# Patient Record
Sex: Male | Born: 1960 | Race: White | Hispanic: No | Marital: Single | Smoking: Current every day smoker
Health system: Southern US, Community
[De-identification: ages and names within clinical notes are randomized; demographics above are authoritative.]

## PROBLEM LIST (undated history)

## (undated) DIAGNOSIS — J189 Pneumonia, unspecified organism: Secondary | ICD-10-CM

## (undated) DIAGNOSIS — I10 Essential (primary) hypertension: Secondary | ICD-10-CM

## (undated) DIAGNOSIS — E7800 Pure hypercholesterolemia, unspecified: Secondary | ICD-10-CM

## (undated) HISTORY — PX: BACK SURGERY: SHX140

## (undated) HISTORY — PX: CHEST TUBE INSERTION: SHX231

## (undated) HISTORY — PX: LEG SURGERY: SHX1003

---

## 2016-02-04 ENCOUNTER — Emergency Department
Admission: EM | Admit: 2016-02-04 | Discharge: 2016-02-04 | Disposition: A | Discharge status: Home / Self Care | Payer: Self-pay | Attending: Emergency Medicine | Admitting: Emergency Medicine

## 2016-02-04 ENCOUNTER — Encounter: Payer: Self-pay | Admitting: Emergency Medicine

## 2016-02-04 ENCOUNTER — Emergency Department: Payer: Self-pay

## 2016-02-04 DIAGNOSIS — E876 Hypokalemia: Secondary | ICD-10-CM | POA: Insufficient documentation

## 2016-02-04 DIAGNOSIS — Z720 Tobacco use: Secondary | ICD-10-CM

## 2016-02-04 DIAGNOSIS — Z79899 Other long term (current) drug therapy: Secondary | ICD-10-CM | POA: Insufficient documentation

## 2016-02-04 DIAGNOSIS — Z880 Allergy status to penicillin: Secondary | ICD-10-CM | POA: Insufficient documentation

## 2016-02-04 DIAGNOSIS — F17210 Nicotine dependence, cigarettes, uncomplicated: Secondary | ICD-10-CM | POA: Insufficient documentation

## 2016-02-04 DIAGNOSIS — J441 Chronic obstructive pulmonary disease with (acute) exacerbation: Secondary | ICD-10-CM | POA: Insufficient documentation

## 2016-02-04 DIAGNOSIS — J069 Acute upper respiratory infection, unspecified: Secondary | ICD-10-CM | POA: Insufficient documentation

## 2016-02-04 DIAGNOSIS — R110 Nausea: Secondary | ICD-10-CM | POA: Insufficient documentation

## 2016-02-04 DIAGNOSIS — I10 Essential (primary) hypertension: Secondary | ICD-10-CM | POA: Insufficient documentation

## 2016-02-04 HISTORY — DX: Pure hypercholesterolemia, unspecified: E78.00

## 2016-02-04 HISTORY — DX: Pneumonia, unspecified organism: J18.9

## 2016-02-04 HISTORY — DX: Essential (primary) hypertension: I10

## 2016-02-04 LAB — COMPREHENSIVE METABOLIC PANEL
ALBUMIN: 4.5 g/dL (ref 3.5–5.0)
ALT: 29 U/L (ref 17–63)
AST: 46 U/L — ABNORMAL HIGH (ref 15–41)
Alkaline Phosphatase: 81 U/L (ref 38–126)
Anion gap: 10 (ref 5–15)
BUN: 12 mg/dL (ref 6–20)
CHLORIDE: 90 mmol/L — AB (ref 101–111)
CO2: 31 mmol/L (ref 22–32)
Calcium: 9.1 mg/dL (ref 8.9–10.3)
Creatinine, Ser: 0.78 mg/dL (ref 0.61–1.24)
GFR calc non Af Amer: >60 mL/min (ref >60)
GLUCOSE: 105 mg/dL — AB (ref 65–99)
Potassium: 3.7 mmol/L (ref 3.5–5.1)
SODIUM: 131 mmol/L — AB (ref 135–145)
Total Bilirubin: 1.3 mg/dL — ABNORMAL HIGH (ref 0.3–1.2)
Total Protein: 8 g/dL (ref 6.5–8.1)

## 2016-02-04 LAB — CBC WITH DIFFERENTIAL/PLATELET
Basophils Absolute: 0 10*3/uL (ref 0–0.1)
Basophils Relative: 1 %
Eosinophils Absolute: 0.3 10*3/uL (ref 0–0.7)
Eosinophils Relative: 4 %
HCT: 43.1 % (ref 40.0–52.0)
Hemoglobin: 15.5 g/dL (ref 13.0–18.0)
LYMPHS ABS: 1.1 10*3/uL (ref 1.0–3.6)
MCH: 34.9 pg — AB (ref 26.0–34.0)
MCHC: 35.9 g/dL (ref 32.0–36.0)
MCV: 97.1 fL (ref 80.0–100.0)
MONO ABS: 0.7 10*3/uL (ref 0.2–1.0)
NEUTROS ABS: 4.9 10*3/uL (ref 1.4–6.5)
Platelets: 216 10*3/uL (ref 150–440)
RBC: 4.44 MIL/uL (ref 4.40–5.90)
RDW: 12.7 % (ref 11.5–14.5)
WBC: 7.1 10*3/uL (ref 3.8–10.6)

## 2016-02-04 LAB — RAPID INFLUENZA A&B ANTIGENS
Influenza A (ARMC): NEGATIVE
Influenza B (ARMC): NEGATIVE

## 2016-02-04 LAB — TROPONIN I: Troponin I: <0.03 ng/mL (ref <0.031)

## 2016-02-04 MED ORDER — POTASSIUM CHLORIDE CRYS ER 20 MEQ PO TBCR
40.0000 meq | EXTENDED_RELEASE_TABLET | Freq: Once | ORAL | Status: AC
  Administered 2016-02-04: 40 meq via ORAL
  Filled 2016-02-04: qty 2

## 2016-02-04 MED ORDER — AZITHROMYCIN 500 MG PO TABS
500.0000 mg | ORAL_TABLET | Freq: Once | ORAL | Status: AC
  Administered 2016-02-04: 500 mg via ORAL
  Filled 2016-02-04: qty 1

## 2016-02-04 MED ORDER — ALBUTEROL SULFATE HFA 108 (90 BASE) MCG/ACT IN AERS: 2.0000 | INHALATION_SPRAY | RESPIRATORY_TRACT | Status: AC | PRN

## 2016-02-04 MED ORDER — PREDNISONE 20 MG PO TABS: 60.0000 mg | ORAL_TABLET | Freq: Every day | ORAL | Status: DC

## 2016-02-04 MED ORDER — PREDNISONE 20 MG PO TABS
60.0000 mg | ORAL_TABLET | Freq: Once | ORAL | Status: AC
  Administered 2016-02-04: 60 mg via ORAL
  Filled 2016-02-04: qty 3

## 2016-02-04 MED ORDER — AZITHROMYCIN 250 MG PO TABS: 250.0000 mg | ORAL_TABLET | Freq: Every day | ORAL | Status: DC

## 2016-02-04 NOTE — ED Notes (Signed)
Pt returned to room  

## 2016-02-04 NOTE — ED Notes (Signed)
Pt verbalized understanding of discharge instructions. NAD at this time. 

## 2016-02-04 NOTE — ED Notes (Signed)
Pt. States he has had shortness of breath, cough with cold chills for the past week.

## 2016-02-04 NOTE — ED Provider Notes (Signed)
[facility] Emergency Department Provider Note  ____________________________________________  Time seen: Approximately 7:45 AM  I have reviewed the triage vital signs and the nursing notes.   HISTORY  Chief Complaint Shortness of Breath    HPI Geoffrey Walker is a 55 y.o. male with a long history of ongoing tobacco abuse, HTN, HL and a history of pneumonia requiring chest tube placement for pleural effusion remotely presenting with cough and congestion. Patient reports that for 1 week he has been having a cough productive of clear sputum. He has also had congestion and mild rhinorrhea without sore throat or ear pain. He has had some mild exertional shortness of breath but no chest pain or palpitations. The patient woke up at 4:30 AM due to coughing spell, and when he stood up to go to the bathroom he developed a lightheaded sensation associated with nausea and followed by diaphoresis. He did not vomit. No diarrhea or abdominal pain. No fevers or chills. At this time, his symptoms have significantly improved.   Past Medical History  Diagnosis Date  . Hypertension   . High cholesterol   . Pneumonia     There are no active problems to display for this patient.   Past Surgical History  Procedure Laterality Date  . Back surgery    . Chest tube insertion    . Leg surgery Right     Current Outpatient Rx  Name  Route  Sig  Dispense  Refill  . atorvastatin (LIPITOR) 40 MG tablet   Oral   Take 40 mg by mouth daily.         . carvedilol (COREG) 12.5 MG tablet   Oral   Take 12.5 mg by mouth 2 (two) times daily with a meal.         . cholecalciferol (VITAMIN D) 1000 units tablet   Oral   Take 1,000 Units by mouth daily.         . hydrochlorothiazide (HYDRODIURIL) 25 MG tablet   Oral   Take 25 mg by mouth daily.         . Multiple Vitamin (MULTIVITAMIN) capsule   Oral   Take 1 capsule by mouth daily.         . vitamin B-12 (CYANOCOBALAMIN)  50 MCG tablet   Oral   Take 50 mcg by mouth daily.         . vitamin E 400 UNIT capsule   Oral   Take 400 Units by mouth daily.         Marland Kitchen. albuterol (PROVENTIL HFA;VENTOLIN HFA) 108 (90 Base) MCG/ACT inhaler   Inhalation   Inhale 2 puffs into the lungs every 4 (four) hours as needed for wheezing or shortness of breath.   1 Inhaler   0   . azithromycin (ZITHROMAX) 250 MG tablet   Oral   Take 1 tablet (250 mg total) by mouth daily.   4 tablet   0   . predniSONE (DELTASONE) 20 MG tablet   Oral   Take 3 tablets (60 mg total) by mouth daily.   15 tablet   0     Allergies Penicillins  History reviewed. No pertinent family history.  Social History Social History  Substance Use Topics  . Smoking status: Current Every Day Smoker -- 1.00 packs/day    Types: Cigarettes  . Smokeless tobacco: None  . Alcohol Use: 14.4 oz/week    24 Cans of beer per week    Review of Systems Constitutional:  No fever/chills. Positive lightheadedness. Negative syncope. Eyes: No visual changes. ENT: No sore throat. Cardiovascular: Denies chest pain, palpitations. Respiratory: Positive exertional shortness of breath.  Positive productive cough. Gastrointestinal: No abdominal pain.  Positive nausea, no vomiting.  No diarrhea.  No constipation. Genitourinary: Negative for dysuria. Musculoskeletal: Negative for back pain. Skin: Negative for rash. Neurological: Negative for headaches, focal weakness or numbness.  10-point ROS otherwise negative.  ____________________________________________   PHYSICAL EXAM:  VITAL SIGNS: ED Triage Vitals  Enc Vitals Group     BP 02/04/16 0552 168/99 mmHg     Pulse Rate 02/04/16 0552 76     Resp 02/04/16 0552 20     Temp 02/04/16 0552 97.7 F (36.5 C)     Temp Source 02/04/16 0552 Oral     SpO2 02/04/16 0552 95 %     Weight 02/04/16 0552 219 lb (99.338 kg)     Height 02/04/16 0552  (1.778 m)     Head Cir --      Peak Flow --      Pain  Score 02/04/16 0552 0     Pain Loc --      Pain Edu? --      Excl. in GC? --     Constitutional: Alert and oriented. Well appearing and in no acute distress. Answer question appropriately. Eyes: Conjunctivae are normal.  EOMI.No eye discharge. Head: Atraumatic. Nose: Positive congestion without active rhinorrhea. Mouth/Throat: Mucous membranes are moist.  Neck: No stridor.  Supple.  No JVD. Cardiovascular: Normal rate, regular rhythm. No murmurs, rubs or gallops.  Respiratory: Normal respiratory effort.  No retractions. Lungs CTAB.  No wheezes, rales or ronchi. Prolonged expiratory rate. Gastrointestinal: Soft and nontender. No distention. No peritoneal signs. Musculoskeletal: No LE edema. No palpable cords or tenderness to palpation in the calves. Negative Homans sign. Neurologic:  Normal speech and language. No gross focal neurologic deficits are appreciated.  Skin:  Skin is warm, dry and intact. No rash noted. Psychiatric: Mood and affect are normal. Speech and behavior are normal.  Normal judgement.  ____________________________________________   LABS (all labs ordered are listed, but only abnormal results are displayed)  Labs Reviewed  COMPREHENSIVE METABOLIC PANEL - Abnormal; Notable for the following:    Sodium 131 (*)    Chloride 90 (*)    Glucose, Bld 105 (*)    AST 46 (*)    Total Bilirubin 1.3 (*)    All other components within normal limits  CBC WITH DIFFERENTIAL/PLATELET - Abnormal; Notable for the following:    MCH 34.9 (*)    All other components within normal limits  RAPID INFLUENZA A&B ANTIGENS ([facility] ONLY)  TROPONIN I   ____________________________________________  EKG  ED ECG REPORT I, Rockne Menghini, the attending physician, personally viewed and interpreted this ECG.   Date: 02/04/2016  EKG Time: 617  Rate: 67  Rhythm: normal sinus rhythm  Axis: Normal  Intervals:none  ST&T Change: Nonspecific T-wave inversion in V1. No ST  elevation.  ____________________________________________  RADIOLOGY  Dg Chest 2 View  02/04/2016  CLINICAL DATA:  55 year old male with a history of shortness of breath. EXAM: CHEST - 2 VIEW COMPARISON:  None. FINDINGS: Cardiomediastinal silhouette within normal limits. No evidence of central vascular congestion. Stigmata of emphysema, with increased retrosternal airspace, flattened hemidiaphragms, increased AP diameter, and hyperinflation on the AP view. Coarsened interstitial markings bilateral lungs. No pneumothorax. No confluent airspace disease. No pleural effusion. Elevation the right hemidiaphragm with blunting of the right costophrenic  angle. No displaced fracture. Unremarkable appearance of the upper abdomen. IMPRESSION: Emphysema and presumed chronic lung changes with no evidence of lobar pneumonia. Signed, Yvone Neu. Loreta Ave, DO [facility] Radiology Electronically Signed   By: Gilmer Mor D.O.   On: 02/04/2016 07:09    ____________________________________________   PROCEDURES  Procedure(s) performed: None  Critical Care performed: No ____________________________________________   INITIAL IMPRESSION / ASSESSMENT AND PLAN / ED COURSE  Pertinent labs & imaging results that were available during my care of the patient were reviewed by me and considered in my medical decision making (see chart for details).  55 y.o. male with long-standing tobacco abuse presenting with cough and congestion, with an episode of diaphoresis nausea and lightheadedness today. The patient has never been diagnosed with COPD but does have emphysematous changes on his chest x-ray which is correlated with the clinical history of decades of tobacco abuse. He likely has a COPD exacerbation, and I will evaluate him for flu. He does not have any evidence of pneumonia. It is less likely that the patient's symptoms are cardiac in origin, with a reassuring EKG. His  troponin is pending. At this time, the patient is breathing comfortably and maintaining oxygen saturations in the mid 90s. I'm awaiting his lab results, and anticipate discharge home with steroids and antibiotics, close PMD follow-up.  ____________________________________________  FINAL CLINICAL IMPRESSION(S) / ED DIAGNOSES  Final diagnoses:  COPD with acute exacerbation (HCC)  URI (upper respiratory infection)  Tobacco abuse  Hypokalemia      NEW MEDICATIONS STARTED DURING THIS VISIT:  Discharge Medication List as of 02/04/2016  8:57 AM    START taking these medications   Details  azithromycin (ZITHROMAX) 250 MG tablet Take 1 tablet (250 mg total) by mouth daily., Starting 02/04/2016, Until Discontinued, Print    predniSONE (DELTASONE) 20 MG tablet Take 3 tablets (60 mg total) by mouth daily., Starting 02/04/2016, Until Discontinued, Print         Rockne Menghini, MD 02/04/16 [phone]

## 2017-08-13 IMAGING — CR DG CHEST 2V
2 series · 2 of 2 positions shown · non-contrast
Comparison: None.

CLINICAL DATA: 54-year-old male with a history of shortness of
breath.

EXAM:
CHEST - 2 VIEW

[chest pa]
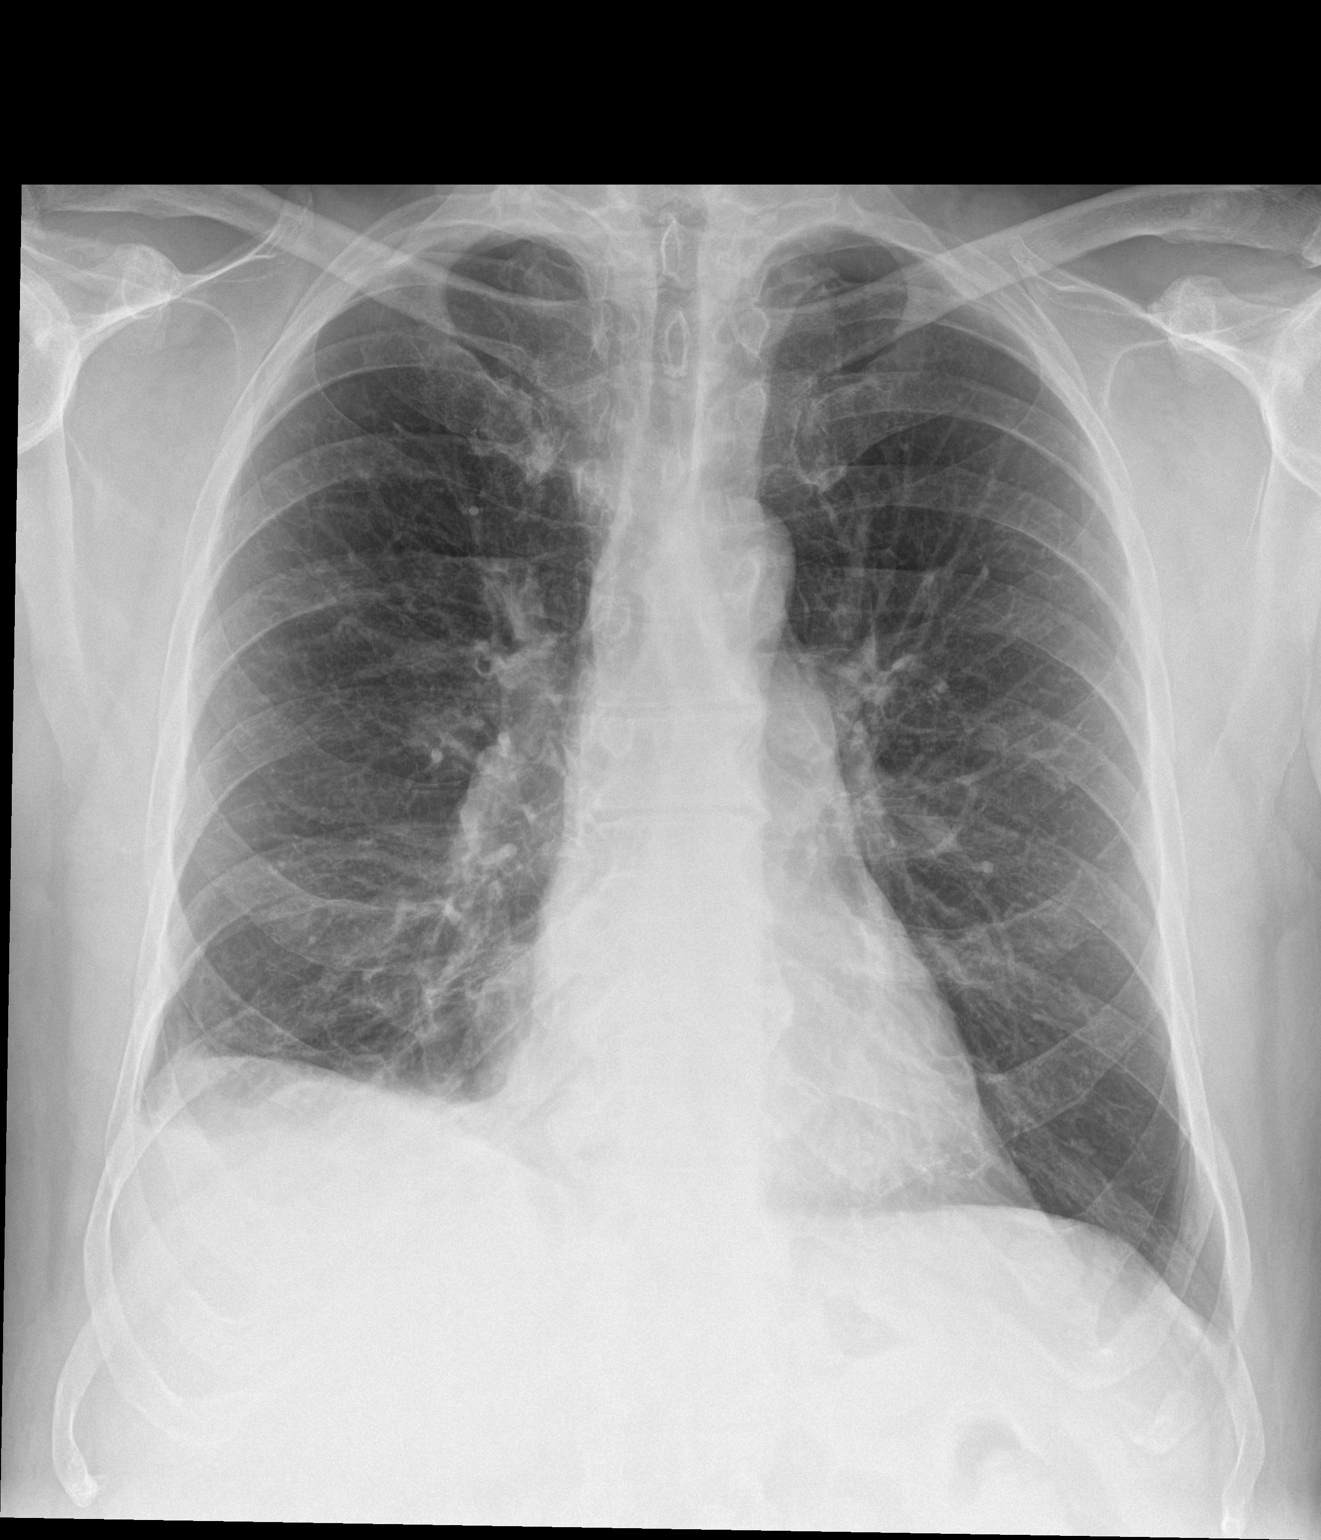

[chest lat]
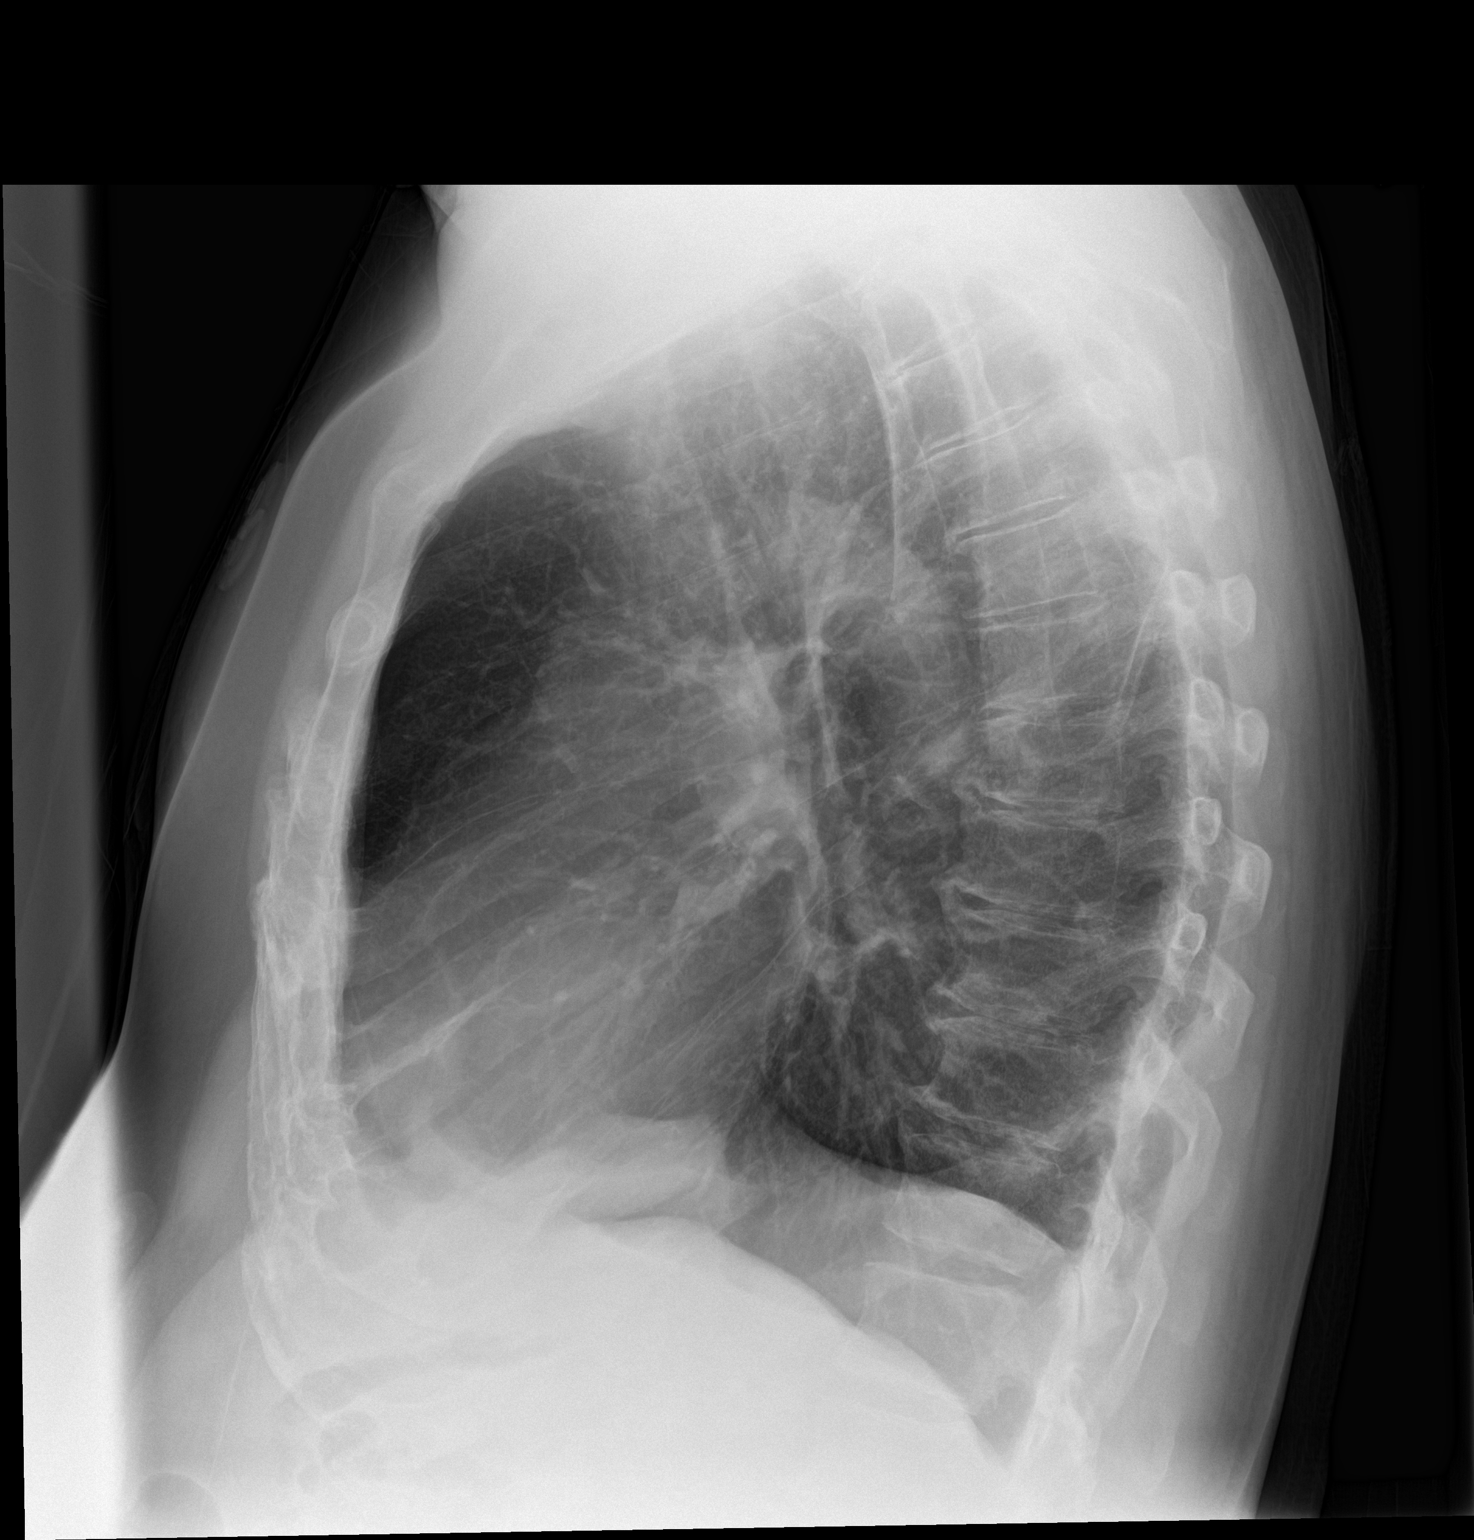

[2 of 2 positions shown; findings below may reference images not displayed]

FINDINGS: Cardiomediastinal silhouette within normal limits. No evidence of
central vascular congestion.

Stigmata of emphysema, with increased retrosternal airspace,
flattened hemidiaphragms, increased AP diameter, and hyperinflation
on the AP view.

Coarsened interstitial markings bilateral lungs.

No pneumothorax. No confluent airspace disease. No pleural effusion.

Elevation the right hemidiaphragm with blunting of the right
costophrenic angle.

No displaced fracture.

Unremarkable appearance of the upper abdomen.
IMPRESSION: Emphysema and presumed chronic lung changes with no evidence of
lobar pneumonia.

## 2018-11-18 ENCOUNTER — Other Ambulatory Visit: Payer: Self-pay | Admitting: Family Medicine

## 2018-11-18 DIAGNOSIS — J449 Chronic obstructive pulmonary disease, unspecified: Secondary | ICD-10-CM

## 2018-12-16 ENCOUNTER — Ambulatory Visit: Payer: Self-pay

## 2019-01-13 ENCOUNTER — Ambulatory Visit: Payer: Self-pay

## 2019-01-15 ENCOUNTER — Ambulatory Visit: Payer: Medicaid Other

## 2019-01-22 ENCOUNTER — Ambulatory Visit: Payer: Medicaid Other

## 2019-01-29 ENCOUNTER — Other Ambulatory Visit: Payer: Self-pay

## 2019-01-29 ENCOUNTER — Ambulatory Visit: Payer: Disability Insurance

## 2019-01-29 ENCOUNTER — Ambulatory Visit
Admission: RE | Admit: 2019-01-29 | Discharge: 2019-01-29 | Disposition: A | Discharge status: Home / Self Care | Payer: Disability Insurance | Source: Ambulatory Visit | Attending: Family Medicine | Admitting: Family Medicine

## 2019-01-29 DIAGNOSIS — J449 Chronic obstructive pulmonary disease, unspecified: Secondary | ICD-10-CM | POA: Diagnosis present

## 2019-01-29 MED ORDER — ALBUTEROL SULFATE (2.5 MG/3ML) 0.083% IN NEBU
2.5000 mg | INHALATION_SOLUTION | Freq: Once | RESPIRATORY_TRACT | Status: AC
  Administered 2019-01-29: 2.5 mg via RESPIRATORY_TRACT
  Filled 2019-01-29: qty 3

## 2024-03-02 ENCOUNTER — Other Ambulatory Visit: Payer: Self-pay

## 2024-03-02 ENCOUNTER — Emergency Department

## 2024-03-02 DIAGNOSIS — Z79899 Other long term (current) drug therapy: Secondary | ICD-10-CM | POA: Insufficient documentation

## 2024-03-02 DIAGNOSIS — W1830XA Fall on same level, unspecified, initial encounter: Secondary | ICD-10-CM | POA: Diagnosis not present

## 2024-03-02 DIAGNOSIS — M545 Low back pain, unspecified: Secondary | ICD-10-CM | POA: Insufficient documentation

## 2024-03-02 DIAGNOSIS — I10 Essential (primary) hypertension: Secondary | ICD-10-CM | POA: Insufficient documentation

## 2024-03-02 NOTE — ED Notes (Signed)
 First Nurse Note: Pt arrives via [facility] from home with complaints of lower back pain following a fall on Saturday on concrete. Rates pain 10/10.  VSS with EMS 2L [place] at baseline

## 2024-03-02 NOTE — ED Triage Notes (Signed)
 Pt to ED via EMS from home, pt reports fall on Saturday when he was standing up from wheelchair, pt states the chair wasn't locked and he fell backwards. Pt reports lower back pain from fall.

## 2024-03-03 ENCOUNTER — Emergency Department
Admission: EM | Admit: 2024-03-03 | Discharge: 2024-03-03 | Disposition: A | Discharge status: Home / Self Care | Attending: Emergency Medicine | Admitting: Emergency Medicine

## 2024-03-03 DIAGNOSIS — M545 Low back pain, unspecified: Secondary | ICD-10-CM

## 2024-03-03 DIAGNOSIS — W19XXXA Unspecified fall, initial encounter: Secondary | ICD-10-CM

## 2024-03-03 MED ORDER — HYDROMORPHONE HCL 1 MG/ML IJ SOLN
2.0000 mg | Freq: Once | INTRAMUSCULAR | Status: AC
  Administered 2024-03-03: 2 mg via INTRAMUSCULAR
  Filled 2024-03-03: qty 2

## 2024-03-03 MED ORDER — LORATADINE 10 MG PO TABS
10.0000 mg | ORAL_TABLET | Freq: Once | ORAL | Status: AC
  Administered 2024-03-03: 10 mg via ORAL
  Filled 2024-03-03: qty 1

## 2024-03-03 MED ORDER — OXYCODONE-ACETAMINOPHEN 5-325 MG PO TABS
1.0000 | ORAL_TABLET | ORAL | Status: DC | PRN
  Administered 2024-03-03: 1 via ORAL
  Filled 2024-03-03: qty 1

## 2024-03-03 MED ORDER — CYCLOBENZAPRINE HCL 10 MG PO TABS
5.0000 mg | ORAL_TABLET | Freq: Once | ORAL | Status: AC
  Administered 2024-03-03: 5 mg via ORAL
  Filled 2024-03-03: qty 1

## 2024-03-03 MED ORDER — KETOROLAC TROMETHAMINE 60 MG/2ML IM SOLN
30.0000 mg | Freq: Once | INTRAMUSCULAR | Status: AC
  Administered 2024-03-03: 30 mg via INTRAMUSCULAR
  Filled 2024-03-03: qty 2

## 2024-03-03 MED ORDER — HYDROMORPHONE HCL 2 MG PO TABS: 2.0000 mg | ORAL_TABLET | Freq: Four times a day (QID) | ORAL | 0 refills | Status: DC | PRN

## 2024-03-03 MED ORDER — OXYCODONE-ACETAMINOPHEN 5-325 MG PO TABS
1.0000 | ORAL_TABLET | Freq: Once | ORAL | Status: AC
  Administered 2024-03-03: 1 via ORAL
  Filled 2024-03-03: qty 1

## 2024-03-03 MED ORDER — CYCLOBENZAPRINE HCL 5 MG PO TABS: ORAL_TABLET | ORAL | 0 refills | Status: DC

## 2024-03-03 NOTE — TOC Transition Note (Signed)
 Transition of Care [facility]) - Discharge Note   Patient Details  Name: Geoffrey Walker MRN: [id number] Date of Birth: 17-Jan-1961  [facility] of [facility]) CM/SW Contact:  Elmira Haddock, LCSW Phone Number: 03/03/2024, 12:37 PM   Clinical Narrative:   CSW received a consult for HH/DME for patient.  Patient discharged prior to CSW being able to speak with him.  CSW phoned patient.  He was at home.  CSW discussed recommendations with patient.  He stated that he already as those services set up through his insurance.  TOC signing off.           Patient Goals and CMS Choice            Discharge Placement                       Discharge Plan and Services Additional resources added to the After Visit Summary for                                       Social Drivers of Health (SDOH) Interventions SDOH Screenings   Food Insecurity: No Food Insecurity (09/21/2023)   Received from [facility]  Transportation Needs: No Transportation Needs (09/21/2023)   Received from [facility]  Utilities: Low Risk  (09/21/2023)   Received from [facility]  Financial Resource Strain: Low Risk  (09/21/2023)   Received from [facility]  Tobacco Use: High Risk (03/02/2024)     Readmission Risk Interventions     No data to display

## 2024-03-03 NOTE — ED Notes (Addendum)
 Pt informed this paramedic that he was living in a hotel alone and was brought to the hotel by family after getting out of rehab.   The pt was informed that he was up for discharge and EMS would not be able to transport him back to the hotel. The pt then became irate and stated that he would not be calling his family this early in the morning and he was feeling "inconvenienced" and it was the hospitals job to figure out he was going to get home.  The pt stated that his son whom lives a mile away form him couldn't come because he works as do the rest of his family members.   The charge nurse walked into the pt's room and informed him that EMS would not transport him to the hospital but he would be able to speak to our social worker with regards to helping him with th following steps of finding housing etc. The pt stated that it was good idea and he would think about it. The pt then demanded that if he was going be staying here he would want to do it with a cup a coffee.   The pt called out a few minutes later and stated that his phone was broken here in this hospital and that staff had to figure out who did it because it was our responsibility. The pt stated that his phone was dropped numerous times. The pt was informed that his phone was on the floor and placed on the bench seat. The pt stated that his 63 year old phone did not work the same as his new phone. The pt was noted to become very irate and accusatory  towards this paramedic. The pt was asked if it was possible if EMS broke his phone he stated that he didn't know because he didn't look at it then.  The pt was informed that his phone still turned on and would probably not work as well has his new phone. The pt stated " I don't care if it's 63 years old. You broke my phone."  The pt was told by this paramedic that he needed to stop yelling at her. The pt then laughed and try to make a joke of the situation when he realized that a Engineer, materials had  entered the room. The pt demeaned to speak to the charge nurse.    The charge nurse entered the pt's room and informed him that his belongings are his responsibility. The pt stated that it was best if this whole topic was dismissed.

## 2024-03-03 NOTE — ED Provider Notes (Signed)
 [facility] Provider Note    Event Date/Time   First MD Initiated Contact with Patient 03/03/24 0136     (approximate)   History   Back Pain   HPI  Geoffrey Walker is a 63 y.o. male brought to the ED via EMS from home with a chief complaint of low back pain status post mechanical fall on Saturday when he was using his rollator.  Patient states he was discharged from rehab after 5 years last week.  Lives independently.  Presents with low back pain and spasms.  Denies extremity weakness/numbness/tingling.  Denies bowel or bladder incontinence.  Denies dysuria.     Past Medical History   Past Medical History:  Diagnosis Date   High cholesterol    Hypertension    Pneumonia      Active Problem List  There are no active problems to display for this patient.    Past Surgical History   Past Surgical History:  Procedure Laterality Date   BACK SURGERY     CHEST TUBE INSERTION     LEG SURGERY Right      Home Medications   Prior to Admission medications   Medication Sig Start Date End Date Taking? Authorizing Provider  cyclobenzaprine  (FLEXERIL ) 5 MG tablet 1 tablet every 8 hours as needed for muscle spasms 03/03/24  Yes Almena Hokenson J, MD  HYDROmorphone  (DILAUDID ) 2 MG tablet Take 1 tablet (2 mg total) by mouth every 6 (six) hours as needed for severe pain (pain score 7-10). 03/03/24  Yes Norlene Beavers, MD  albuterol  (PROVENTIL  HFA;VENTOLIN  HFA) 108 (90 Base) MCG/ACT inhaler Inhale 2 puffs into the lungs every 4 (four) hours as needed for wheezing or shortness of breath. 02/04/16   Bart Lieu, MD  atorvastatin (LIPITOR) 40 MG tablet Take 40 mg by mouth daily.    [provider]  azithromycin  (ZITHROMAX ) 250 MG tablet Take 1 tablet (250 mg total) by mouth daily. 02/04/16   Bart Lieu, MD  carvedilol (COREG) 12.5 MG tablet Take 12.5 mg by mouth 2 (two) times daily with a meal.    [provider]  cholecalciferol  (VITAMIN D) 1000 units tablet Take 1,000 Units by mouth daily.    [provider]  hydrochlorothiazide (HYDRODIURIL) 25 MG tablet Take 25 mg by mouth daily.    [provider]  Multiple Vitamin (MULTIVITAMIN) capsule Take 1 capsule by mouth daily.    [provider]  predniSONE  (DELTASONE ) 20 MG tablet Take 3 tablets (60 mg total) by mouth daily. 02/04/16   Bart Lieu, MD  vitamin B-12 (CYANOCOBALAMIN) 50 MCG tablet Take 50 mcg by mouth daily.    [provider]  vitamin E 400 UNIT capsule Take 400 Units by mouth daily.    [provider]     Allergies  Penicillins   Family History  History reviewed. No pertinent family history.   Physical Exam  Triage Vital Signs: ED Triage Vitals  Encounter Vitals Group     BP 03/02/24 2219 (!) 141/72     Systolic BP Percentile --      Diastolic BP Percentile --      Pulse Rate 03/02/24 2219 (!) 104     Resp 03/02/24 2219 18     Temp 03/02/24 2219 98.1 F (36.7 C)     Temp Source 03/02/24 2219 Oral     SpO2 03/02/24 2219 99 %     Weight 03/02/24 2218 260 lb (117.9 kg)  Height 03/02/24 2218 5\' 10"  (1.778 m)     Head Circumference --      Peak Flow --      Pain Score 03/02/24 2218 10     Pain Loc --      Pain Education --      Exclude from Growth Chart --     Updated Vital Signs: BP (!) 151/91 (BP Location: Right Arm)   Pulse 100   Temp 98.4 F (36.9 C)   Resp 18   Ht 5\' 10"  (1.778 m)   Wt 117.9 kg   SpO2 100%   BMI 37.31 kg/m    General: Awake, mild distress.  CV:  RRR.  Good peripheral perfusion.  Resp:  Normal effort.  CTAB. Abd:  Morbidly obese.  No distention.  Other:  No spinal tenderness to palpation.  Bilateral paralumbar muscle spasms.  Negative straight leg raise.  5/5 motor strength and sensation BLE.   ED Results / Procedures / Treatments  Labs (all labs ordered are listed, but only abnormal results are displayed) Labs Reviewed - No data to  display   EKG  None   RADIOLOGY I have independently visualized interpreted patient's imaging study as well as noted the radiology interpretation:  Lumbar spine: No acute osseous injury  Official radiology report(s): DG Lumbar Spine 2-3 Views Result Date: 03/02/2024 CLINICAL DATA:  Low back pain after fall. EXAM: LUMBAR SPINE - 2-3 VIEW COMPARISON:  None Available. FINDINGS: Five non-rib-bearing lumbar vertebra. No evidence of acute fracture or compression deformity. Normal lumbar alignment. Mild diffuse disc space narrowing and spurring. Mild multilevel facet hypertrophy. The sacroiliac joints are congruent. IMPRESSION: 1. No fracture or subluxation of the lumbar spine. 2. Multilevel degenerative disc disease and facet hypertrophy. Electronically Signed   By: Chadwick Colonel M.D.   On: 03/02/2024 23:11     PROCEDURES:  Critical Care performed: No  Procedures   MEDICATIONS ORDERED IN ED: Medications  ketorolac  (TORADOL ) injection 30 mg (30 mg Intramuscular Given 03/03/24 0211)  oxyCODONE -acetaminophen  (PERCOCET/ROXICET) 5-325 MG per tablet 1 tablet (1 tablet Oral Given 03/03/24 0212)  cyclobenzaprine  (FLEXERIL ) tablet 5 mg (5 mg Oral Given 03/03/24 0208)  HYDROmorphone  (DILAUDID ) injection 2 mg (2 mg Intramuscular Given 03/03/24 0422)     IMPRESSION / MDM / ASSESSMENT AND PLAN / ED COURSE  I reviewed the triage vital signs and the nursing notes.                             63 year old male who presents with low back pain status post mechanical fall.  Imaging studies negative for acute osseous injury.  Will administer IM Ketorolac , Percocet, Flexeril  and reassess.   Patient's presentation is most consistent with acute, uncomplicated illness.  Clinical Course as of 03/03/24 2956  Tue Mar 03, 2024  0356 Slight improvement in pain but still complaining of severe pain.  Will add IM Dilaudid  and reassess.  Remains neurologically intact. [JS]  0518 Patient feeling better after IM  Dilaudid .  Will discharge home with prescriptions and patient will follow-up closely with his PCP.  Will write prescription for wheelchair.  Strict return precautions given.  Patient verbalizes understanding and agrees with plan of care. [JS]  0620 Patient living in local motel.  EMS will not transport back to motel.  Patient does not wish to call family members at this time given the early hour.  Will also consult TOC for home health needs.  Patient  states when he was discharged from rehab they asked him to set everything up but he does not have the resources to do so. [JS]    Clinical Course User Index [JS] Norlene Beavers, MD   FINAL CLINICAL IMPRESSION(S) / ED DIAGNOSES   Final diagnoses:  Acute bilateral low back pain without sciatica  Fall, initial encounter     Rx / DC Orders   ED Discharge Orders          Ordered    cyclobenzaprine  (FLEXERIL ) 5 MG tablet        03/03/24 0520    HYDROmorphone  (DILAUDID ) 2 MG tablet  Every 6 hours PRN        03/03/24 0520    Wheelchair        03/03/24 0521             Note:  This document was prepared using Dragon voice recognition software and may include unintentional dictation errors.   Norlene Beavers, MD 03/03/24 [phone]

## 2024-03-03 NOTE — Discharge Instructions (Signed)
 You may take medicines as needed for pain or muscle spasms.  Use wheelchair as needed to get around.  Return to the ER for worsening symptoms, persistent vomiting, difficulty breathing or other concerns.

## 2024-03-03 NOTE — Evaluation (Signed)
 Physical Therapy Evaluation Patient Details Name: Lorena Clearman MRN: [id number] DOB: 09-23-1961 Today's Date: 03/03/2024  History of Present Illness  63 y/o male presented to ED on 03/02/24 after fall on Saturday (4/19) on concrete. Imaging negative for fx. PMH: hx of lung removal 2021, HTN, hx of pneumonia  Clinical Impression  Patient admitted with the above. PTA, patient living at extended stay hotel after 5 year stay at rehab facility per patient. Patient reporting he uses rollator at baseline. Patient able to complete bed mobility and sit to stand independently with RW. Patient adamantly declining ambulation as patient stating "I know I can walk". Per patient, [facility] services was set up for him at discharge from [facility] No further skilled PT needs identified acutely.         If plan is discharge home, recommend the following:     Can travel by private vehicle        Equipment Recommendations None recommended by PT  Recommendations for Other Services       Functional Status Assessment Patient has not had a recent decline in their functional status     Precautions / Restrictions Precautions Precautions: Fall Recall of Precautions/Restrictions: Intact Restrictions Weight Bearing Restrictions Per Provider Order: No      Mobility  Bed Mobility Overal bed mobility: Independent                  Transfers Overall transfer level: Modified independent Equipment used: Rolling Cadynce Garrette (2 wheels)               General transfer comment: able to stand from elevated stretcher modI. Adamantly declined further mobility    Ambulation/Gait                  Stairs            Wheelchair Mobility     Tilt Bed    Modified Rankin (Stroke Patients Only)       Balance Overall balance assessment: Mild deficits observed, not formally tested                                           Pertinent Vitals/Pain Pain Assessment Pain  Assessment: No/denies pain    Home Living Family/patient expects to be discharged to:: Other (Comment)                   Additional Comments: currently living in extended stay hotel after being discharged from rehab after 5 years per patient    Prior Function Prior Level of Function : Independent/Modified Independent             Mobility Comments: uses rollator       Extremity/Trunk Assessment   Upper Extremity Assessment Upper Extremity Assessment: Overall WFL for tasks assessed    Lower Extremity Assessment Lower Extremity Assessment: Overall WFL for tasks assessed       Communication   Communication Communication: No apparent difficulties    Cognition Arousal: Alert Behavior During Therapy: WFL for tasks assessed/performed   PT - Cognitive impairments: No apparent impairments                         Following commands: Intact       Cueing       General Comments      Exercises     Assessment/Plan  PT Assessment All further PT needs can be met in the next venue of care  PT Problem List Decreased strength;Decreased activity tolerance;Decreased balance;Decreased mobility;Decreased safety awareness;Decreased knowledge of precautions       PT Treatment Interventions      PT Goals (Current goals can be found in the Care Plan section)  Acute Rehab PT Goals Patient Stated Goal: to go home PT Goal Formulation: All assessment and education complete, DC therapy    Frequency       Co-evaluation               AM-PAC PT "6 Clicks" Mobility  Outcome Measure Help needed turning from your back to your side while in a flat bed without using bedrails?: None Help needed moving from lying on your back to sitting on the side of a flat bed without using bedrails?: None Help needed moving to and from a bed to a chair (including a wheelchair)?: None Help needed standing up from a chair using your arms (e.g., wheelchair or bedside  chair)?: None Help needed to walk in hospital room?: A Little Help needed climbing 3-5 steps with a railing? : A Little 6 Click Score: 22    End of Session Equipment Utilized During Treatment: Oxygen Activity Tolerance: Patient tolerated treatment well Patient left: in bed;with call bell/phone within reach Nurse Communication: Mobility status PT Visit Diagnosis: Unsteadiness on feet (R26.81);Muscle weakness (generalized) (M62.81)    Time: 0454-0981 PT Time Calculation (min) (ACUTE ONLY): 21 min   Charges:   PT Evaluation $PT Eval Moderate Complexity: 1 Mod   PT General Charges $$ ACUTE PT VISIT: 1 Visit         Janine Melbourne, PT, DPT Physical Therapist - [facility]  [facility]   Aoki Wedemeyer A Akeila Lana 03/03/2024, 10:34 AM

## 2024-03-05 ENCOUNTER — Emergency Department
Admission: EM | Admit: 2024-03-05 | Discharge: 2024-03-06 | Disposition: A | Discharge status: Home / Self Care | Attending: Emergency Medicine | Admitting: Emergency Medicine

## 2024-03-05 ENCOUNTER — Other Ambulatory Visit: Payer: Self-pay

## 2024-03-05 ENCOUNTER — Encounter: Payer: Self-pay | Admitting: *Deleted

## 2024-03-05 DIAGNOSIS — M545 Low back pain, unspecified: Secondary | ICD-10-CM | POA: Diagnosis present

## 2024-03-05 DIAGNOSIS — M25552 Pain in left hip: Secondary | ICD-10-CM | POA: Diagnosis not present

## 2024-03-05 DIAGNOSIS — M5442 Lumbago with sciatica, left side: Secondary | ICD-10-CM | POA: Insufficient documentation

## 2024-03-05 DIAGNOSIS — W19XXXA Unspecified fall, initial encounter: Secondary | ICD-10-CM | POA: Diagnosis not present

## 2024-03-05 DIAGNOSIS — I10 Essential (primary) hypertension: Secondary | ICD-10-CM | POA: Insufficient documentation

## 2024-03-05 NOTE — ED Triage Notes (Signed)
 Pt brought in via ems from home with back pain.  Pt taking rx meds without relief.  Pt was seen here recently for similar sx.  Pt states he isn't any better.  Pt alert

## 2024-03-06 ENCOUNTER — Emergency Department

## 2024-03-06 MED ORDER — IPRATROPIUM-ALBUTEROL 0.5-2.5 (3) MG/3ML IN SOLN
3.0000 mL | Freq: Once | RESPIRATORY_TRACT | Status: AC
  Administered 2024-03-06: 3 mL via RESPIRATORY_TRACT
  Filled 2024-03-06: qty 3

## 2024-03-06 MED ORDER — KETOROLAC TROMETHAMINE 30 MG/ML IJ SOLN
30.0000 mg | Freq: Once | INTRAMUSCULAR | Status: AC
  Administered 2024-03-06: 30 mg via INTRAMUSCULAR
  Filled 2024-03-06: qty 1

## 2024-03-06 MED ORDER — HYDROMORPHONE HCL 1 MG/ML IJ SOLN
1.0000 mg | INTRAMUSCULAR | Status: AC
  Administered 2024-03-06: 1 mg via INTRAMUSCULAR
  Filled 2024-03-06: qty 1

## 2024-03-06 MED ORDER — PREDNISONE 20 MG PO TABS: 40.0000 mg | ORAL_TABLET | Freq: Every day | ORAL | 0 refills | Status: AC

## 2024-03-06 MED ORDER — LORATADINE 10 MG PO TABS
10.0000 mg | ORAL_TABLET | Freq: Once | ORAL | Status: AC
  Administered 2024-03-06: 10 mg via ORAL
  Filled 2024-03-06: qty 1

## 2024-03-06 NOTE — ED Notes (Signed)
 Pt Aunt Audree Leas [phone] contacted to p/u Pt, per charge RN OK to move Pt to waiting area with 2L Oxygen requirement on a fully oxygen tank. Pt moved, Lenon Radar states she will p/u Pt and bring his Portable Oxygen and Rolator walker. Pt understood and given D/c instructions and moved to waiting area. Incorrect contact removed from chart and Lenon Radar was added at Pt request. Consulting civil engineer and EDP aware.

## 2024-03-06 NOTE — ED Notes (Signed)
 Patient transported to CT

## 2024-03-06 NOTE — ED Notes (Signed)
 Family member calls back wanting to speak to SW, this RN explains Pt has been d/c after she stated she can come p/u Pt and waiting for a ride, first nurse and Charge RN made aware.

## 2024-03-06 NOTE — ED Notes (Addendum)
 Pt requesting Claritin , water, coffee, multiple towels, and a cushion wheelchair when he gets d/c. Pt is on O2 so will need to stay in hallway if d/c and wait on ride as he doesn't have his portable O2 tank. EDP made aware of OTC request.

## 2024-03-06 NOTE — ED Notes (Signed)
 Pt demanding to stay here because he cannot wake family at this hour, this RN explained the process of the ER and that we would need to find him a ride that can pick up his portable machine. Pt states he doesn't have his cell phone on him and he doesn't know his aunts number. This RN tried his Sister contact listed in the chart, this RN will attempt again.

## 2024-03-06 NOTE — ED Notes (Signed)
 Patient says that he could not find his cell phone.  He thought it may be in the basket of the wheelchair he had used.  The wheelchair he was in was still beside him at bedside, and no cell phone was in it.  He did have his charger which had been in the basket.  Efforts were made to contact the EMS crew who brought him to the ED.  No cell phone was found in the back of their ambulance.  Patient believes it may have possibly fallen from his pocket when transferring from his rollator at home onto the EMS stretcher.

## 2024-03-06 NOTE — ED Provider Notes (Signed)
 [facility] Provider Note    Event Date/Time   First MD Initiated Contact with Patient 03/05/24 2327     (approximate)   History   Back Pain   HPI  Geoffrey Walker is a 64 y.o. male with history of hypertension, prior pneumonia and recent evaluation in the ER 3 days ago for back pain after falling  Geoffrey Walker about 3 days ago onto hard concrete on his left lower back.  Persistent pain.  Reports pain was better when he left the ER but unable to get his prescription for his pain medication filled as "nobody had it"  Ongoing pain especially when he twists or bends in his left lower back.  Does not involve the lungs or mid back.  He reports it will sometimes shoot some pain down the left buttock and left leg.  There is no numbness or weakness.  Comfortable when at rest, worsened by twisting bending and moving.     Physical Exam   Triage Vital Signs: ED Triage Vitals  Encounter Vitals Group     BP 03/05/24 2136 (!) 131/95     Systolic BP Percentile --      Diastolic BP Percentile --      Pulse Rate 03/05/24 2134 (!) 117     Resp 03/05/24 2134 20     Temp 03/05/24 2134 (!) 97.5 F (36.4 C)     Temp Source 03/05/24 2134 Oral     SpO2 03/05/24 2136 96 %     Weight 03/05/24 2134 260 lb (117.9 kg)     Height 03/05/24 2134 5\' 10"  (1.778 m)     Head Circumference --      Peak Flow --      Pain Score 03/05/24 2134 10     Pain Loc --      Pain Education --      Exclude from Growth Chart --     Most recent vital signs: Vitals:   03/05/24 2134 03/05/24 2136  BP:  (!) 131/95  Pulse: (!) 117   Resp: 20   Temp: (!) 97.5 F (36.4 C)   SpO2:  96%     General: Awake, no distress.  He does report pain in his back especially when he moves though it does seem that he has been doing a fair amount of pain with twisting especially try to roll over or positions up and down CV:  Good peripheral perfusion.  Resp:  Normal effort.  Abd:  No distention.   Other:  Abdomen is soft nontender nondistended Demonstrates 5 out of 5 strength in the lower extremities able to flex and extend at the hips knees ankles and feet.  Left foot is warm and well-perfused with palpable pulses.  No sensory deficits in the lower extremities.  Reports normal sensation along the saddle region.  No urinary symptoms  Reports focal palpable tenderness to palpation that reproduces pain when palpating the very left lower lumbar/paraspinous region.  No lesion noted.  Able to roll onto his side  No hip shortening or focal tenderness over the hip joint  ED Results / Procedures / Treatments   Labs (all labs ordered are listed, but only abnormal results are displayed) Labs Reviewed - No data to display   EKG     RADIOLOGY  CT PELVIS WO CONTRAST Result Date: 03/06/2024 CLINICAL DATA:  Status post fall. EXAM: CT PELVIS WITHOUT CONTRAST TECHNIQUE: Multidetector CT imaging of the pelvis was performed following the standard protocol  without intravenous contrast. RADIATION DOSE REDUCTION: This exam was performed according to the departmental dose-optimization program which includes automated exposure control, adjustment of the mA and/or kV according to patient size and/or use of iterative reconstruction technique. COMPARISON:  None Available. FINDINGS: Urinary Tract:  No abnormality visualized. Bowel:  Unremarkable visualized pelvic bowel loops. Vascular/Lymphatic: There is marked severity calcification of the bilateral common iliac arteries, without evidence of aneurysmal dilatation. No abnormal pelvic lymph nodes are identified. Reproductive:  No mass or other significant abnormality Other:  None. Musculoskeletal: Degenerative changes are seen involving both hips and the visualized portion of the lower lumbar spine. No acute osseous abnormality is identified. IMPRESSION: 1. Degenerative changes without evidence of an acute osseous abnormality. Electronically Signed   By: Geoffrey Walker M.D.   On: 03/06/2024 01:19   CT Lumbar Spine Wo Contrast Result Date: 03/06/2024 CLINICAL DATA:  Low back pain, fall EXAM: CT LUMBAR SPINE WITHOUT CONTRAST TECHNIQUE: Multidetector CT imaging of the lumbar spine was performed without intravenous contrast administration. Multiplanar CT image reconstructions were also generated. RADIATION DOSE REDUCTION: This exam was performed according to the departmental dose-optimization program which includes automated exposure control, adjustment of the mA and/or kV according to patient size and/or use of iterative reconstruction technique. COMPARISON:  None Available. FINDINGS: Segmentation: 5 lumbar type vertebrae. Alignment: Normal. Vertebrae: No acute fracture or focal pathologic process. Paraspinal and other soft tissues: Cholelithiasis. Calcific aortic atherosclerosis. Disc levels: No spinal canal stenosis or neural impingement. IMPRESSION: 1. No acute fracture or static subluxation of the lumbar spine. 2. Cholelithiasis. Aortic Atherosclerosis (ICD10-I70.0). Electronically Signed   By: Geoffrey Walker M.D.   On: 03/06/2024 01:04      PROCEDURES:  Critical Care performed: No  Procedures   MEDICATIONS ORDERED IN ED: Medications  ketorolac  (TORADOL ) 30 MG/ML injection 30 mg (30 mg Intramuscular Given 03/06/24 0030)  HYDROmorphone  (DILAUDID ) injection 1 mg (1 mg Intramuscular Given 03/06/24 0029)     IMPRESSION / MDM / ASSESSMENT AND PLAN / ED COURSE  I reviewed the triage vital signs and the nursing notes.                              Differential diagnosis includes, but is not limited to, injury suffered from blunt fall, reports ongoing pain in the lower lumbar region.  Recent lumbar imaging with plain films was negative.  Given the patient's age and history will obtain CT without contrast to further evaluate and exclude subtle or occult fracture, mild compression fracture etc. as potential cause.  He does have some radicular component pain  rating down his left buttock at times but no symptoms that would suggest acute cauda equina.  He has excellent strength in his lower extremities bilateral, he has no loss of sensation, reports no urinary symptomatology.  His pain is primarily alleviated by rest worsened by twisting and bending causing suspicion for possible musculoskeletal component or lumbosacral strain etc.  Patient's presentation is most consistent with acute complicated illness / injury requiring diagnostic workup.      Clinical Course as of 03/06/24 0304  Fri Mar 06, 2024  0300 Resting comfortably pain is controlled.  He advises that the [facility] pharmacy was not able to fill his pain prescription prior, but he is going to be going to [place] today and will see if he can get it transferred there.  He is awake alert oriented.  He is in no distress, comfortable with  plan for discharge. [MQ]    Clinical Course User Index [MQ] Iver Marker, MD   Vitals:   03/05/24 2136 03/06/24 0314  BP: (!) 131/95 136/79  Pulse:  97  Resp:  20  Temp:  97.6 F (36.4 C)  SpO2: 96% 96%     Return precautions and treatment recommendations and follow-up discussed with the patient who is agreeable with the plan.  Patient agreeable to following up with orthopedics.  He is not driving  FINAL CLINICAL IMPRESSION(S) / ED DIAGNOSES   Final diagnoses:  Acute left-sided low back pain with left-sided sciatica     Rx / DC Orders   ED Discharge Orders          Ordered    predniSONE  (DELTASONE ) 20 MG tablet  Daily with breakfast        03/06/24 0302             Note:  This document was prepared using Dragon voice recognition software and may include unintentional dictation errors.   Iver Marker, MD 03/06/24 [phone]

## 2024-03-06 NOTE — ED Notes (Signed)
 This RN called Lenon Radar back after speaking with Consulting civil engineer,. Lenon Radar states she will be by to p/u Pt with Rolator and O2 and take him to his place of residence.

## 2024-07-24 NOTE — H&P (Addendum)
 Family Medicine Inpatient Service History & Physical  Assessment & Plan:  Geoffrey Walker is a 63 y.o. male whose presentation is complicated by remote h/o lung cancer s/o pneumonectomy, COPD, chronic O2 need, obesity, HTN, chronic venous stasis,  that presented to [facility] Emergency Department with progressive dyspnea on exertion and was found to have:   Active Problems:   Tobacco use disorder   Hypertension   Alcohol abuse   Mixed hyperlipidemia (RAF-HCC)   COPD exacerbation    (CMS-HCC)   S/P lobectomy of lung   Adjustment disorder with mixed anxiety and depressed mood   Oxygen dependent   Moderate recurrent major depression (CMS-HCC)   COPD   Acute bilateral low back pain without sciatica   Hyponatremia   Chronic hypoxic respiratory failure    (CMS-HCC)   Hypomagnesemia    Active Problems  Hyponatremia.  Patient appears relatively euvolemic on my exam history of cirrhosis, ESRD, CHF that could cause volume overload.  He does have a COPD exacerbation likely from sinusitis, however ER staff felt that he has a pneumonia and treated with levofloxacin.  Certainly the history of the pneumonectomy and chronic lung disease could cause a chronic hyponatremia via SIADH.  However he may simply be volume depleted due to 2 to 3 weeks of his COPD exacerbation through insensible losses of fluids.  There is no evidence of adrenal failure or thyroid disease that could contribute to hyponatremia.  The very low chloride also points towards a hypovolemic hyponatremia. - Recheck sodium now.  The patient has not received any isotonic fluids yet - He has urine studies including, sodium, creatinine pending to help decide between SIADH in which case we need to fluid restrict or hypovolemia in which case he would need IV fluids.SABRA  COPD exacerbation.  He describes 3 weeks of progressive DOE with sputum quantity and quality change over the past week.  His RPP is negative and I cannot appreciate a  pneumonia on his somewhat limited chest x-ray.  He clearly has wheezing throughout all of the left lung fields and briefly required more oxygen. - Prednisone  burst 40mg  x 5days. - He received levofloxacin 750 mg in the ED will not need antibiotics until tomorrow.  Plan to start doxycycline  tomorrow. - Will attempt to obtain sputum culture. - DuoNebs every 6 for now.  Consider ICS/LABA to go with Spiriva and as needed albuterol  at discharge.  Hypomagnesemia.  Probably related to alcohol consumption Status post 2 g IV Recheck in a.m.  AKI.  Baseline creatinine is around 1.1 This is further evidence of probable hypovolemic hyponatremia and expect to improve with IV fluids and time.  Chronic venous stasis with medial right lower extremity superficial ulceration.  No sign of infection WOCN consulted. Wet-to-dry or Dakin's will be sufficient for now.  He also has a right toe wound from trauma.  Chronic Problems  Acute on chronic LBP. Recent coccyx fracture per patient at OSH with known lumbar DDD. Stable. Continued PRN Tylenol  and Oxycodone .  HTN. Stable. Well controlled. Continue Coreg  12.52 times a day Continue losartan 50 I am going to hold HCTZ and Lasix at least overnight  HLD. The ASCVD Risk score (Arnett DK, et al., 2019) failed to calculate. Continue Atorvastatin 10mg  nightly.  # Tobacco use disorder: Patient currently dipping but has quit smoking. Will place inpatient consult to tobacco cessation for toxic effects of tobacco with COPD exacerbation and possible nicotine withdrawal. - Tobacco cessation consult - Nicotine replacement therapy with gum and  lozenge PRN       Issues Impacting Complexity of Management: <redacted file path> -The patient is at high risk for the development of complications of volume overload due to the need to provide IV hydration for suspected hypovolemia in the setting of: single lung and chronic venous stasis -Need for the following intensive  monitoring parameter(s) due to high risk of clinical decline: continuous oxygen monitoring and frequent monitoring of urine output to assess for the efficacy of diuretic regimen -Need for intensive oxygen therapy of McG[place]or escalation , which places the patient at high risk for oxygen toxicity   Medical Decision Making: Reviewed records from the following unique sources chart review. Independently interpreted CXR, EKG notable for tracheal deviation and cardiac deviation to the right..   Checklist: Diet: Regular Diet DVT PPx: Lovenox  40mg  q24h Code Status: DNR and DNI Dispo: Patient appropriate for Inpatient based on expectation of ongoing need for hospitalization greater than two midnights based on severity of presentation/services including COPD exacerbation and hyponatremia  Team Contact Information:  Primary Team: Family Medicine [place] Primary Resident: FAIRY BRING [facility], MD Resident's Pager: Barbar Suellyn Fordyce [phone])  Chief Concern:  No Principal Problem: There is no principal problem currently on the Problem List. Please update the Problem List and refresh.   Subjective:  Geoffrey Walker is a 63 y.o. male with pertinent PMHx of COPD, HTN, venous stasis, tobacco use disorder,  presenting with progressive DOE    Been 3 weeks or so of being SOB. He gets bad allergies this time of year and thought it was allergies. Started using nebulizer a few weeks ago and it was helping temporarily. He has stayed on Spiriva and Albuterol  but switched to nebulizer in early AM, around 0300-0400 and it helped a lot.  He noticed he has been coughing up more than usual especially after the nebs and then he feels better after expectoration. He notes a yellow, green sputum now that was typically not a particular color/mostly clear.  No diarrhea, rashes (BLE rashes are stable), fevers, sore throat, constipation, malaise, poor appetite. + sinus headaches intermittently.  + dysphagia with pills and with some  solids ver since trach decannulation. He saw GI who did EGD with stretching at [facility]. + wheezing.  Lung cancer s/p rt pneumonectomy about 5 years ago. Uses 1L, 2L with exertion.  History obtained by patient.    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 Lives in a hotel now after leaving [employer] [facility] where he lived for 5 years. He fell about 3 months ago and hurt his coccyx (saw [facility] for that). He can take about 120 steps now.   DNR/DNI, reviewed at admission.  He has 4 kids but they are not close. He states his daughter would be the closest and who he would want to make decisions if he was not able.   HCDM (patient stated preference): Steinhauser, Katlyn - Daughter - [phone]   HCDM (patient stated preference): Yunus, [place] - Son - [phone]   Pertinent Surgical Hx Right pneumonectomy  Pertinent Family Hx No asthma/COPD  Pertinent Social Hx  See above. Still dips varying amount per day. Binge drinks a few days per week. No withdrawal symptoms. Rollator, walker. Can't walk very far. Lives in a hotel.  Allergies Penicillins  I reviewed the Medication List. He takes them inconsistently. Prior to Admission medications  Medication Dose, Route, Frequency  acetaminophen  (TYLENOL ) 325 MG tablet 650 mg, Oral, Every 6 hours PRN  albuterol  2.5 mg /3 mL (0.083 %)  nebulizer solution Inhale the contents of 1 vial (2.5 mg total) by nebulization every four (4) hours as needed for wheezing.  albuterol  HFA 90 mcg/actuation inhaler 2 puffs, Inhalation, Every 6 hours PRN  albuterol  HFA 90 mcg/actuation inhaler 2 puffs, Inhalation, Every 4 hours PRN  carvedilol  (COREG ) 12.5 MG tablet 12.5 mg, Oral, 2 times a day (standard)  famotidine (PEPCID) 20 MG tablet 20 mg, Oral, 2 times a day (standard)  furosemide (LASIX) 20 MG tablet 20 mg, Oral, Daily (standard)  guaiFENesin 200 mg/5 mL Liqd 10 mL, Every 4 hours PRN  hydroCHLOROthiazide (HYDRODIURIL) 25 MG tablet 25 mg, Oral, Daily (standard)   HYDROcodone-acetaminophen  (NORCO) 5-325 mg per tablet 1 tablet, Oral, 2 times a day PRN  ipratropium-albuterol  (DUO-NEB) 0.5-2.5 mg/3 mL nebulizer 3 mL, Nebulization, Every 6 hours PRN  loperamide (IMODIUM A-D) 2 mg tablet 2 mg, 4 times a day PRN  losartan (COZAAR) 50 MG tablet Take 1 tablet (50 mg total) by mouth daily for high blood pressure  melatonin 3 mg Tab 6 mg, Oral, Every evening  mv-min-FA-vit K-lutein-zeaxant (PRESERVISION AREDS 2 PLUS MV) 200 mcg-15 mcg- 5 mg-1 mg cap 1 capsule, 2 times a day  oxymetazoline (AFRIN) 0.05 % nasal spray 2 sprays, 2 times a day (standard)  rosuvastatin  (CRESTOR ) 5 MG tablet 5 mg, Oral, Nightly  senna (SENOKOT) 8.6 mg tablet 2 tablets, Daily (standard)  sodium chloride  (OCEAN) 0.65 % nasal spray 1 spray, 2 times a day  thiamine (B-1) 100 MG tablet 100 mg, Daily (standard)  tiotropium bromide (SPIRIVA RESPIMAT) 2.5 mcg/actuation inhalation mist 2 puffs, Inhalation, Daily (RT)  naloxone (NARCAN) 4 mg nasal spray 4 mg, Alternating Nares, Once as needed, One spray in either nostril once for known/suspected opioid overdose. May repeat every 2-3 minutes in alternating nostril til EMS arrives    [employer] Decision Maker: Mr. Kirker currently has decisional capacity for healthcare decision-making and is able to designate a surrogate healthcare decision maker. Mr. Billy designated healthcare decision maker(s) is   HCDM (patient stated preference): Challis, Katlyn - Daughter - [phone]   HCDM (patient stated preference): Warrick, Llera - Son - [phone]   Objective:  Physical Exam: Temp:  [36.6 C (97.9 F)-36.7 C (98.1 F)] 36.6 C (97.9 F) Pulse:  [96-107] 107 SpO2 Pulse:  [100-104] 100 Resp:  [18-22] 18 BP: (102-154)/(79-91) 122/79 SpO2:  [98 %-100 %] 99 %  Gen: NAD, converses. Eyes: Sclera anicteric, EOMI grossly normal  HENT: Atraumatic, normocephalic Neck: Trachea midline. Trach scar is midline and well  healed/epitheliazed. Heart: RRR, deviated to the right. Muffled and distant but without murmurs. Lungs: absent noises on right, diffuse end expiratory wheezing on left and poor air movement. Abdomen: Soft, NTND. Well healed surgical scars. Extremities: 2+ NP edema to the knees. There is woody edema and a superficial erythematous lesion on the medial right calf. Neuro: Grossly symmetric, non-focal. Wears glasses. Skin: woody edema. Cap refill is <2 sec. Psych: Alert, oriented  Admission Attestation: I saw and evaluated the patient, participating in the key portions of the service.  I personally spent 85 minutes face-to-face and non-face-to-face in the care of this patient, which includes all pre, intra, and post visit time on the date of service. All documented time was specific to E/M and does not include any procedures that may have been performed.    JOSEPH SHEPPARD FAIL, MD

## 2024-07-27 NOTE — Discharge Summary (Signed)
 " Physician Discharge Summary [facility] [address] [place] [address] Dept: [phone] Loc: [phone]   Identifying Information:  Geoffrey Walker [birth year] [id number]  Primary Care Physician: Barbaraann Dorothyann Agee, MD  Code Status: DNR and DNI  Admit Date: 07/24/2024  Discharge Date: 08/12/2024   Discharge To: Home  Discharge Service: [facility] - FAM - [place]   Discharge Attending Physician: No att. providers found  Discharge Diagnoses: Principal Problem:   Hyponatremia Active Problems:   Tobacco use disorder   Hypertension   Alcohol abuse   Mixed hyperlipidemia (RAF-HCC)   COPD exacerbation    (CMS-HCC)   S/P lobectomy of lung   Adjustment disorder with mixed anxiety and depressed mood   Oxygen dependent   Moderate recurrent major depression (CMS-HCC)   COPD   Acute bilateral low back pain without sciatica   Chronic hypoxic respiratory failure    (CMS-HCC)   Hypomagnesemia   Traumatic coccydynia   Tear of right biceps muscle   Outpatient Provider Follow Up Issues:  [ ]  consider ENT referral for dysphagia s/p tracheostomy  [ ]  Orthopedic surgery referral placed for torn biceps [ ]  blood pressure control with stopping hydrochlorothiazide [ ]  fluid status with stopping lasix and hydrochlorothiazide  Hospital Course:  Geoffrey Walker is a 63 y.o. 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 His discharge was delayed due to unstable housing.  Hypovolemic Hyponatremia Found to have Na of 125 on presentation to the ED for DOE. Initial urine labs showed Urine sodium >20 at 28 and a Uosm of 182. FeUrea equivalent to <1% FeNa in patient on hydrochlorothiazide. Preponderence of evidence and his response to IV NS suggests that this was mostly hypovolemic hyponatremia from  insensible losses. He also may have some chronic malnutrition and is at risk for SIADH from COPD/single lung physiology. Improved after 1000cc NS bolus on 9/12 and remaining >130 with free access to PO. Na level normalized to 135. Will not continue hydrochlorothiazide on discharge.   COPD exacerbation in setting of one lung Presented with 3 weeks of progressive DOE with sputum quantity and quality change over the past week. His RPP is negative and no evidence of pneumonia.  He clearly has wheezing throughout all of the left lung fields and briefly required more oxygen. Completed prednisone  burst, scheduled duonebs, and doxycyline. By the time of discharge he was feeling better and was back to baseline O2 of 1LNC. Discharged on stiolto.   Pill/Tablet Dysphagia. Stable. Present ever since tracheostomy scar appeared. SLT consulted and modified barium has been ordered. Consider ENT referral as an outpatient.   Hypomagnesemia. Resolved. Status post 2 g IV now.    AKI.  Baseline creatinine is around 1. Continued Losartan and self diuretics during admission with resolution of AKI.   Chronic venous stasis with medial right lower extremity superficial ulceration.  No sign of infection. WOCN consulted. Wet-to-dry or Dakin's will be sufficient for now. Do not recommend restarting Lasix at discharge.   C/f Right biceps tear. He has a bulging muscle belly distal humerus but has intact flexion, limited elbow extension. Referral to SRO at discharge, discussed with patient that in-hospital surgery is not indicated.  Touchbase with Outpatient Provider: Warm Handoff: Completed on 08/12/24 by LYNWOOD MITCHELL RAKE, MD  (Resident) via [facility]  Procedures: None No admission procedures  for hospital encounter. ______________________________________________________________________ Discharge Medications:   Your Medication List     STOP taking these medications    furosemide 20 MG tablet Commonly  known as: LASIX   hydroCHLOROthiazide 25 MG tablet Commonly known as: HYDRODIURIL   SPIRIVA RESPIMAT 2.5 mcg/actuation inhalation mist Generic drug: tiotropium bromide       START taking these medications    ammonium lactate 12 % lotion Commonly known as: LAC-HYDRIN Apply 1 Application topically two (2) times a day.   hydrOXYzine  25 MG tablet Commonly known as: ATARAX  Take 1 tablet (25 mg total) by mouth every eight (8) hours as needed for anxiety.   ibuprofen 200 MG tablet Commonly known as: ADVIL,MOTRIN Take 1 tablet (200 mg total) by mouth at noon.   loratadine  10 mg tablet Commonly known as: CLARITIN  Take 1 tablet (10 mg total) by mouth daily.   tiotropium-olodaterol 2.5-2.5 mcg/actuation Mist Inhale 2 puffs daily.       CHANGE how you take these medications    albuterol  2.5 mg /3 mL (0.083 %) nebulizer solution Inhale the contents of 1 vial (2.5 mg total) by nebulization every four (4) hours as needed for wheezing. What changed: Another medication with the same name was removed. Continue taking this medication, and follow the directions you see here.   albuterol  90 mcg/actuation inhaler Commonly known as: PROVENTIL  HFA;VENTOLIN  HFA Inhale 2 puffs every four (4) hours as needed for wheezing. What changed: Another medication with the same name was removed. Continue taking this medication, and follow the directions you see here.       CONTINUE taking these medications    acetaminophen  325 MG tablet Commonly known as: TYLENOL  Take 2 tablets (650 mg total) by mouth every six (6) hours as needed.   carvedilol  12.5 MG tablet Commonly known as: COREG  Take 1 tablet (12.5 mg total) by mouth two (2) times a day.   famotidine 20 MG tablet Commonly known as: PEPCID Take 1 tablet (20 mg total) by mouth Two (2) times a day.   guaiFENesin 200 mg/5 mL Liqd Take 10 mL by mouth every four (4) hours as needed (cough).   HYDROcodone-acetaminophen  5-325 mg per  tablet Commonly known as: NORCO Take 1 tablet by mouth two (2) times a day as needed for pain.   ipratropium-albuterol  0.5-2.5 mg/3 mL nebulizer Commonly known as: DUO-NEB Inhale 3 mL by nebulization every six (6) hours as needed.   loperamide 2 mg tablet Commonly known as: IMODIUM A-D Take 1 tablet (2 mg total) by mouth four (4) times a day as needed for diarrhea.   losartan 50 MG tablet Commonly known as: COZAAR Take 1 tablet (50 mg total) by mouth daily for high blood pressure   melatonin 3 mg Tab Take 2 tablets (6 mg total) by mouth every evening.   naloxone 4 mg/actuation nasal spray Commonly known as: NARCAN 1 spray (4 mg total) into alternating nostrils once as needed for up to 1 dose. One spray in either nostril once for known/suspected opioid overdose. May repeat every 2-3 minutes in alternating nostril til EMS arrives   PRESERVISION AREDS 2 PLUS MV 200 mcg-15 mcg- 5 mg-1 mg Cap Generic drug: mv-min-FA-vit K-lutein-zeaxant Take 1 capsule by mouth two (2) times a day.   rosuvastatin  5 MG tablet Commonly known as: CRESTOR  Take 1 tablet (5 mg total) by mouth nightly.   senna 8.6 mg tablet Commonly known as: SENOKOT Take 2 tablets by mouth in the morning.   sodium chloride  0.65 %  nasal spray Commonly known as: OCEAN 1 spray into each nostril two (2) times a day.   thiamine 100 MG tablet Commonly known as: B-1 Take 1 tablet (100 mg total) by mouth in the morning.        Allergies: Penicillins ______________________________________________________________________ Pending Test Results (if blank, then none):    Most Recent Labs: All lab results last 24 hours -  No results found for this or any previous visit (from the past 24 hours).   Relevant Studies/Radiology (if blank, then none): ED POCUS Result Date: 07/27/2024 Limited Thoracic Ultrasound (CPT: [phone]) Indication: A focused ultrasound of the pleural spaces was performed to evaluate for  pneumothorax or pulmonary edema. The ultrasound was performed with the following indications, as noted in the H&P: Dyspnea Identified structures: The thoracic cavities and diaphragm were examined. Findings: Exam of the above structures revealed the following findings in the pleural spaces:  Evaluation of pneumothorax: Absent     B-lines: Present:   Left      Pleural effusion: Absent     Other: Impression: Other: focal b lines L Interpreted by: Toribio JINNY Desanctis, MD  ECG 12 Lead Result Date: 07/24/2024 NORMAL SINUS RHYTHM CANNOT RULE OUT ANTERIOR INFARCT  (CITED ON OR BEFORE 24-Jul-2024) ABNORMAL ECG WHEN COMPARED WITH ECG OF 24-Jul-2024 13:06, NO SIGNIFICANT CHANGE WAS FOUND Confirmed by Von Shawl ([id number]) on 07/24/2024 5:40:08 PM  ECG 12 Lead Result Date: 07/24/2024 NORMAL SINUS RHYTHM POSSIBLE ANTERIOR INFARCT  , AGE UNDETERMINED ABNORMAL ECG WHEN COMPARED WITH ECG OF 20-Sep-2023 23:58, BORDERLINE CRITERIA FOR ANTERIOR INFARCT  ARE NOW PRESENT Confirmed by Von Shawl ([id number]) on 07/24/2024 2:15:09 PM  XR Chest 1 view Portable Result Date: 07/24/2024 EXAM: XR CHEST PORTABLE ACCESSION: [id number] UN REPORT DATE: 07/24/2024 1:08 PM CLINICAL INDICATION: SHORTNESS OF BREATH  TECHNIQUE: Single View AP Chest Radiograph. COMPARISON: XR CHEST PORTABLE 09/20/2023 FINDINGS: Post-operative changes of right pneumonectomy with unchanged leftward shift of the cardiomediastinal silhouette. Left lung is clear.   Post-right pneumonectomy with no acute abnormalities.   ______________________________________________________________________ Discharge Instructions:      Other Instructions     Discharge instructions     You were admitted to [facility] for shortness of breath found to have a COPD exacerbation. You were treated with prednisone  and doxycycline , along with scheduled duoneb treatments. We switched your inhaler to Stiolto which adds one medication to the Spiriva you are currently using and should  help improve your COPD. You can take nebulizer treatments as needed, which are written on your prescriptions. We also restarted Claritin  and your ibuprofen.   During your admission you were also found to have low sodium. Most likely this was due to dehydration, possibly from taking the lasix and not drinking enough. It also could be related to hydrochlorothiazide. Your sodium came back to normal during your stay with IV fluids. We stopped your lasix and your hydrochlorothiazide and you should continue to avoid taking those medications. If you notice swelling in your legs and increase in your blood pressure you should contact your primary care doctor before the 10-14 day follow up appointment we will arrange for you.   For your leg wounds continue applying the cream to it as needed.  For your biceps tear we have placed a referral to orthopedic surgery and someone will call you to schedule this appointment.  Please carefully read and follow these instructions below upon your discharge:  1) Please take your medications as prescribed and note the changes listed on your discharge. At  future follow-up appointments, please be sure to take all of your medications with you so your provider can better guide your care.   2) Seek medical care with your primary care doctor or local Emergency Room or Urgent Care if you develop any changes in your mental status, worsening abdominal pain, fevers greater than 101.5, any unexplained/unrelieved shortness of breath, uncontrolled nausea and vomiting that keeps you from remaining hydrated or taking your medication, or any other concerning symptoms.   3) Please go to your follow-up appointments. Some of your follow-up appointments have been listed below. If you do not see an appointment listed below with your primary care doctor, please call your doctor's office as soon as possible to schedule an appointment to be seen within 7-10 days of discharge.   4) If you have any  concerns before you are able to follow-up with your primary care doctor, you can reach us  by calling [phone] and asking to page the resident on call.          Follow Up instructions and Outpatient Referrals    Ambulatory Referral to Gastroenterology     Reason for referral: manometry for dysphagia, consider redilation therapy  for cricopharyngeal narrowing   Ambulatory Referral to Orthopedics     Reason for referral: torn biceps tendon   Specific Service Requested: Non-Joint   Choose Non-Joint: Arm   Is this referral for a new fracture?: No   Discharge instructions         ______________________________________________________________________ Discharge Day Services: BP 136/89   Pulse 85   Temp 36.7 C (98.1 F) (Oral)   Resp 16   Ht 177.8 cm (5' 10)   Wt (!) 105.7 kg (233 lb 1.6 oz)   SpO2 98%   BMI 33.45 kg/m  Pt seen on the day of discharge and determined appropriate for discharge.  Gen: NAD, converses  HENT: atraumatic, normocephalic. Jamal site is well-healed. Heart: RRR, no MRG. Lungs: No increased WOB. Abdomen: soft, NTND Extremities: Diffuse xerosis now.  Condition at Discharge: fair  Length of Discharge: I spent greater than 30 mins in the discharge of this patient.   "

## 2024-11-07 ENCOUNTER — Other Ambulatory Visit: Payer: Self-pay

## 2024-11-07 ENCOUNTER — Inpatient Hospital Stay
Admission: EM | Admit: 2024-11-07 | Discharge: 2024-11-27 | DRG: 871 | Disposition: A | Discharge status: Skilled Nursing Facility | Payer: MEDICAID | Attending: Internal Medicine | Admitting: Internal Medicine

## 2024-11-07 ENCOUNTER — Emergency Department: Payer: MEDICAID

## 2024-11-07 DIAGNOSIS — R531 Weakness: Secondary | ICD-10-CM | POA: Diagnosis not present

## 2024-11-07 DIAGNOSIS — J1008 Influenza due to other identified influenza virus with other specified pneumonia: Secondary | ICD-10-CM | POA: Diagnosis present

## 2024-11-07 DIAGNOSIS — E878 Other disorders of electrolyte and fluid balance, not elsewhere classified: Secondary | ICD-10-CM | POA: Diagnosis not present

## 2024-11-07 DIAGNOSIS — R54 Age-related physical debility: Secondary | ICD-10-CM | POA: Diagnosis present

## 2024-11-07 DIAGNOSIS — I10 Essential (primary) hypertension: Secondary | ICD-10-CM | POA: Diagnosis present

## 2024-11-07 DIAGNOSIS — D638 Anemia in other chronic diseases classified elsewhere: Secondary | ICD-10-CM | POA: Diagnosis present

## 2024-11-07 DIAGNOSIS — J181 Lobar pneumonia, unspecified organism: Secondary | ICD-10-CM | POA: Diagnosis present

## 2024-11-07 DIAGNOSIS — Z79899 Other long term (current) drug therapy: Secondary | ICD-10-CM

## 2024-11-07 DIAGNOSIS — Z716 Tobacco abuse counseling: Secondary | ICD-10-CM | POA: Diagnosis not present

## 2024-11-07 DIAGNOSIS — J440 Chronic obstructive pulmonary disease with acute lower respiratory infection: Secondary | ICD-10-CM | POA: Diagnosis present

## 2024-11-07 DIAGNOSIS — R7989 Other specified abnormal findings of blood chemistry: Secondary | ICD-10-CM | POA: Diagnosis not present

## 2024-11-07 DIAGNOSIS — Z880 Allergy status to penicillin: Secondary | ICD-10-CM

## 2024-11-07 DIAGNOSIS — J09X1 Influenza due to identified novel influenza A virus with pneumonia: Secondary | ICD-10-CM | POA: Diagnosis not present

## 2024-11-07 DIAGNOSIS — K5900 Constipation, unspecified: Secondary | ICD-10-CM | POA: Diagnosis present

## 2024-11-07 DIAGNOSIS — F17210 Nicotine dependence, cigarettes, uncomplicated: Secondary | ICD-10-CM | POA: Diagnosis present

## 2024-11-07 DIAGNOSIS — Z85118 Personal history of other malignant neoplasm of bronchus and lung: Secondary | ICD-10-CM

## 2024-11-07 DIAGNOSIS — Z86718 Personal history of other venous thrombosis and embolism: Secondary | ICD-10-CM

## 2024-11-07 DIAGNOSIS — F419 Anxiety disorder, unspecified: Secondary | ICD-10-CM | POA: Diagnosis present

## 2024-11-07 DIAGNOSIS — D696 Thrombocytopenia, unspecified: Secondary | ICD-10-CM | POA: Diagnosis present

## 2024-11-07 DIAGNOSIS — Z720 Tobacco use: Secondary | ICD-10-CM | POA: Diagnosis not present

## 2024-11-07 DIAGNOSIS — I2489 Other forms of acute ischemic heart disease: Secondary | ICD-10-CM | POA: Diagnosis present

## 2024-11-07 DIAGNOSIS — J9621 Acute and chronic respiratory failure with hypoxia: Secondary | ICD-10-CM | POA: Diagnosis present

## 2024-11-07 DIAGNOSIS — Z1152 Encounter for screening for COVID-19: Secondary | ICD-10-CM | POA: Diagnosis not present

## 2024-11-07 DIAGNOSIS — J111 Influenza due to unidentified influenza virus with other respiratory manifestations: Secondary | ICD-10-CM

## 2024-11-07 DIAGNOSIS — E7800 Pure hypercholesterolemia, unspecified: Secondary | ICD-10-CM | POA: Diagnosis present

## 2024-11-07 DIAGNOSIS — K5909 Other constipation: Secondary | ICD-10-CM | POA: Diagnosis not present

## 2024-11-07 DIAGNOSIS — R000 Tachycardia, unspecified: Secondary | ICD-10-CM | POA: Diagnosis present

## 2024-11-07 DIAGNOSIS — J690 Pneumonitis due to inhalation of food and vomit: Secondary | ICD-10-CM | POA: Diagnosis not present

## 2024-11-07 DIAGNOSIS — J9601 Acute respiratory failure with hypoxia: Secondary | ICD-10-CM | POA: Diagnosis not present

## 2024-11-07 DIAGNOSIS — R9389 Abnormal findings on diagnostic imaging of other specified body structures: Secondary | ICD-10-CM | POA: Diagnosis present

## 2024-11-07 DIAGNOSIS — Z8701 Personal history of pneumonia (recurrent): Secondary | ICD-10-CM

## 2024-11-07 DIAGNOSIS — A4189 Other specified sepsis: Principal | ICD-10-CM | POA: Diagnosis present

## 2024-11-07 DIAGNOSIS — J439 Emphysema, unspecified: Secondary | ICD-10-CM | POA: Diagnosis present

## 2024-11-07 DIAGNOSIS — D649 Anemia, unspecified: Secondary | ICD-10-CM | POA: Insufficient documentation

## 2024-11-07 DIAGNOSIS — E8729 Other acidosis: Secondary | ICD-10-CM | POA: Diagnosis present

## 2024-11-07 DIAGNOSIS — R6520 Severe sepsis without septic shock: Secondary | ICD-10-CM | POA: Diagnosis present

## 2024-11-07 DIAGNOSIS — Z902 Acquired absence of lung [part of]: Secondary | ICD-10-CM

## 2024-11-07 DIAGNOSIS — I878 Other specified disorders of veins: Secondary | ICD-10-CM | POA: Diagnosis present

## 2024-11-07 DIAGNOSIS — E876 Hypokalemia: Secondary | ICD-10-CM | POA: Diagnosis present

## 2024-11-07 DIAGNOSIS — I5A Non-ischemic myocardial injury (non-traumatic): Secondary | ICD-10-CM | POA: Diagnosis present

## 2024-11-07 DIAGNOSIS — Z91148 Patient's other noncompliance with medication regimen for other reason: Secondary | ICD-10-CM

## 2024-11-07 DIAGNOSIS — A419 Sepsis, unspecified organism: Secondary | ICD-10-CM | POA: Diagnosis not present

## 2024-11-07 LAB — CBC WITH DIFFERENTIAL/PLATELET
Abs Immature Granulocytes: 0.14 K/uL — ABNORMAL HIGH (ref 0.00–0.07)
Basophils Absolute: 0 K/uL (ref 0.0–0.1)
Basophils Relative: 0 %
Eosinophils Absolute: 0 K/uL (ref 0.0–0.5)
Eosinophils Relative: 0 %
HCT: 38.4 % — ABNORMAL LOW (ref 39.0–52.0)
Hemoglobin: 12.4 g/dL — ABNORMAL LOW (ref 13.0–17.0)
Immature Granulocytes: 1 %
Lymphocytes Relative: 13 %
Lymphs Abs: 2 K/uL (ref 0.7–4.0)
MCH: 29.2 pg (ref 26.0–34.0)
MCHC: 32.3 g/dL (ref 30.0–36.0)
MCV: 90.6 fL (ref 80.0–100.0)
Monocytes Absolute: 1.1 K/uL — ABNORMAL HIGH (ref 0.1–1.0)
Monocytes Relative: 7 %
Neutro Abs: 12.1 K/uL — ABNORMAL HIGH (ref 1.7–7.7)
Neutrophils Relative %: 79 %
Platelets: 281 K/uL (ref 150–400)
RBC: 4.24 MIL/uL (ref 4.22–5.81)
RDW: 13.2 % (ref 11.5–15.5)
WBC: 15.4 K/uL — ABNORMAL HIGH (ref 4.0–10.5)
nRBC: 0 % (ref 0.0–0.2)

## 2024-11-07 LAB — COMPREHENSIVE METABOLIC PANEL WITH GFR
ALT: 7 U/L (ref 0–44)
AST: 18 U/L (ref 15–41)
Albumin: 3.2 g/dL — ABNORMAL LOW (ref 3.5–5.0)
Alkaline Phosphatase: 105 U/L (ref 38–126)
Anion gap: 15 (ref 5–15)
BUN: 13 mg/dL (ref 8–23)
CO2: 30 mmol/L (ref 22–32)
Calcium: 9 mg/dL (ref 8.9–10.3)
Chloride: 95 mmol/L — ABNORMAL LOW (ref 98–111)
Creatinine, Ser: 0.86 mg/dL (ref 0.61–1.24)
GFR, Estimated: >60 mL/min
Glucose, Bld: 118 mg/dL — ABNORMAL HIGH (ref 70–99)
Potassium: 3.2 mmol/L — ABNORMAL LOW (ref 3.5–5.1)
Sodium: 140 mmol/L (ref 135–145)
Total Bilirubin: 0.7 mg/dL (ref 0.0–1.2)
Total Protein: 7.3 g/dL (ref 6.5–8.1)

## 2024-11-07 LAB — URINALYSIS, W/ REFLEX TO CULTURE (INFECTION SUSPECTED)
Bacteria, UA: NONE SEEN
Glucose, UA: NEGATIVE mg/dL
Hgb urine dipstick: NEGATIVE
Ketones, ur: 20 mg/dL — AB
Leukocytes,Ua: NEGATIVE
Nitrite: NEGATIVE
Protein, ur: >=300 mg/dL — AB
Specific Gravity, Urine: 1.03 (ref 1.005–1.030)
pH: 6 (ref 5.0–8.0)

## 2024-11-07 LAB — CBC
HCT: 36.4 % — ABNORMAL LOW (ref 39.0–52.0)
Hemoglobin: 11.7 g/dL — ABNORMAL LOW (ref 13.0–17.0)
MCH: 29 pg (ref 26.0–34.0)
MCHC: 32.1 g/dL (ref 30.0–36.0)
MCV: 90.1 fL (ref 80.0–100.0)
Platelets: 235 K/uL (ref 150–400)
RBC: 4.04 MIL/uL — ABNORMAL LOW (ref 4.22–5.81)
RDW: 13.1 % (ref 11.5–15.5)
WBC: 12.6 K/uL — ABNORMAL HIGH (ref 4.0–10.5)
nRBC: 0 % (ref 0.0–0.2)

## 2024-11-07 LAB — RESP PANEL BY RT-PCR (RSV, FLU A&B, COVID)  RVPGX2
Influenza A by PCR: POSITIVE — AB
Influenza B by PCR: NEGATIVE
Resp Syncytial Virus by PCR: NEGATIVE
SARS Coronavirus 2 by RT PCR: NEGATIVE

## 2024-11-07 LAB — TROPONIN T, HIGH SENSITIVITY
Troponin T High Sensitivity: 43 ng/L — ABNORMAL HIGH (ref 0–19)
Troponin T High Sensitivity: 40 ng/L — ABNORMAL HIGH (ref 0–19)

## 2024-11-07 LAB — LACTIC ACID, PLASMA
Lactic Acid, Venous: 2.5 mmol/L (ref 0.5–1.9)
Lactic Acid, Venous: 1.7 mmol/L (ref 0.5–1.9)

## 2024-11-07 LAB — MAGNESIUM: Magnesium: 1.7 mg/dL (ref 1.7–2.4)

## 2024-11-07 LAB — PROTIME-INR
INR: 1.1 (ref 0.8–1.2)
Prothrombin Time: 14.6 s (ref 11.4–15.2)

## 2024-11-07 LAB — PHOSPHORUS: Phosphorus: 1.8 mg/dL — ABNORMAL LOW (ref 2.5–4.6)

## 2024-11-07 LAB — CBG MONITORING, ED: Glucose-Capillary: 162 mg/dL — ABNORMAL HIGH (ref 70–99)

## 2024-11-07 LAB — PRO BRAIN NATRIURETIC PEPTIDE: Pro Brain Natriuretic Peptide: 6888 pg/mL — ABNORMAL HIGH

## 2024-11-07 MED ORDER — SODIUM CHLORIDE 0.9 % IV SOLN
2.0000 g | Freq: Once | INTRAVENOUS | Status: AC
  Administered 2024-11-07: 2 g via INTRAVENOUS
  Filled 2024-11-07: qty 12.5

## 2024-11-07 MED ORDER — DOXYCYCLINE HYCLATE 100 MG PO TABS
100.0000 mg | ORAL_TABLET | Freq: Once | ORAL | Status: AC
  Administered 2024-11-07: 100 mg via ORAL
  Filled 2024-11-07: qty 1

## 2024-11-07 MED ORDER — ACETAMINOPHEN 650 MG RE SUPP: 650.0000 mg | Freq: Four times a day (QID) | RECTAL | Status: DC | PRN

## 2024-11-07 MED ORDER — LACTATED RINGERS IV SOLN
150.0000 mL/h | INTRAVENOUS | Status: DC
  Administered 2024-11-07: 150 mL/h via INTRAVENOUS

## 2024-11-07 MED ORDER — ACETAMINOPHEN 325 MG PO TABS
650.0000 mg | ORAL_TABLET | Freq: Four times a day (QID) | ORAL | Status: DC | PRN
  Administered 2024-11-12 – 2024-11-26 (×12): 650 mg via ORAL
  Filled 2024-11-07 (×9): qty 2

## 2024-11-07 MED ORDER — ROSUVASTATIN CALCIUM 5 MG PO TABS
5.0000 mg | ORAL_TABLET | Freq: Every day | ORAL | Status: DC
  Administered 2024-11-07 – 2024-11-12 (×6): 5 mg via ORAL
  Filled 2024-11-07 (×2): qty 1

## 2024-11-07 MED ORDER — ONDANSETRON HCL 4 MG/2ML IJ SOLN
4.0000 mg | Freq: Four times a day (QID) | INTRAMUSCULAR | Status: DC | PRN
  Administered 2024-11-08: 4 mg via INTRAVENOUS
  Filled 2024-11-07: qty 2

## 2024-11-07 MED ORDER — ENOXAPARIN SODIUM 40 MG/0.4ML IJ SOSY
40.0000 mg | PREFILLED_SYRINGE | INTRAMUSCULAR | Status: DC
  Administered 2024-11-07 – 2024-11-20 (×14): 40 mg via SUBCUTANEOUS
  Filled 2024-11-07 (×10): qty 0.4

## 2024-11-07 MED ORDER — OXYCODONE HCL 5 MG PO TABS
5.0000 mg | ORAL_TABLET | ORAL | Status: DC | PRN
  Administered 2024-11-08 – 2024-11-12 (×8): 5 mg via ORAL
  Filled 2024-11-07: qty 1

## 2024-11-07 MED ORDER — VANCOMYCIN HCL IN DEXTROSE 1-5 GM/200ML-% IV SOLN
1000.0000 mg | Freq: Once | INTRAVENOUS | Status: AC
  Administered 2024-11-07: 1000 mg via INTRAVENOUS
  Filled 2024-11-07: qty 200

## 2024-11-07 MED ORDER — ONDANSETRON HCL 4 MG PO TABS: 4.0000 mg | ORAL_TABLET | Freq: Four times a day (QID) | ORAL | Status: DC | PRN

## 2024-11-07 MED ORDER — POTASSIUM PHOSPHATES 15 MMOLE/5ML IV SOLN
15.0000 mmol | Freq: Once | INTRAVENOUS | Status: AC
  Administered 2024-11-07: 15 mmol via INTRAVENOUS
  Filled 2024-11-07: qty 5

## 2024-11-07 MED ORDER — POTASSIUM CHLORIDE 20 MEQ PO PACK: 40.0000 meq | PACK | Freq: Two times a day (BID) | ORAL | Status: DC

## 2024-11-07 MED ORDER — HYDROMORPHONE HCL 2 MG PO TABS
2.0000 mg | ORAL_TABLET | Freq: Four times a day (QID) | ORAL | Status: DC | PRN
  Administered 2024-11-09: 2 mg via ORAL

## 2024-11-07 MED ORDER — ALBUTEROL SULFATE (2.5 MG/3ML) 0.083% IN NEBU
2.5000 mg | INHALATION_SOLUTION | RESPIRATORY_TRACT | Status: DC | PRN
  Administered 2024-11-09 – 2024-11-24 (×14): 2.5 mg via RESPIRATORY_TRACT
  Filled 2024-11-07 (×8): qty 3

## 2024-11-07 MED ORDER — CARVEDILOL 12.5 MG PO TABS
12.5000 mg | ORAL_TABLET | Freq: Two times a day (BID) | ORAL | Status: DC
  Administered 2024-11-07 – 2024-11-25 (×33): 12.5 mg via ORAL
  Filled 2024-11-07 (×2): qty 1
  Filled 2024-11-07: qty 2
  Filled 2024-11-07 (×4): qty 1
  Filled 2024-11-07: qty 2
  Filled 2024-11-07 (×4): qty 1
  Filled 2024-11-07: qty 2
  Filled 2024-11-07 (×9): qty 1

## 2024-11-07 MED ORDER — ALBUTEROL SULFATE (2.5 MG/3ML) 0.083% IN NEBU
2.5000 mg | INHALATION_SOLUTION | RESPIRATORY_TRACT | Status: DC
  Administered 2024-11-07 – 2024-11-08 (×6): 2.5 mg via RESPIRATORY_TRACT
  Filled 2024-11-07 (×6): qty 3

## 2024-11-07 MED ORDER — SODIUM CHLORIDE 0.9 % IV BOLUS
1000.0000 mL | Freq: Once | INTRAVENOUS | Status: AC
  Administered 2024-11-07: 1000 mL via INTRAVENOUS

## 2024-11-07 NOTE — ED Provider Notes (Signed)
 "  [facility] Provider Note    Event Date/Time   First MD Initiated Contact with Patient 11/07/24 1458     (approximate)   History   Shortness of Breath   HPI  Geoffrey Walker is a 63 y.o. male who comes in with shortness of breath over the past 5 days.  Patient has a history of right lung removal 5 years ago.  Patient is on 2 L oxygen at home at baseline but had to be increased to 5 L at home with no relief of his shortness of breath.  Patient does have history of hypertension noncompliant with his medications.  Patient reports about 1 week of not feeling well.  Reports having history of pneumonia and this feeling very similar.  He reports a nonproductive cough and gurgling and chest fullness.  He reports some generalized just not wanting to eat or drink.  No significant pain in his abdomen.  He denies any falls hitting his head.  Any swelling his legs history of blood clots or any other concerns.  Physical Exam   Triage Vital Signs: ED Triage Vitals  Encounter Vitals Group     BP 11/07/24 1438 (!) 155/88     Girls Systolic BP Percentile --      Girls Diastolic BP Percentile --      Boys Systolic BP Percentile --      Boys Diastolic BP Percentile --      Pulse Rate 11/07/24 1438 (!) 135     Resp 11/07/24 1438 (!) 25     Temp 11/07/24 1438 97.7 F (36.5 C)     Temp Source 11/07/24 1438 Oral     SpO2 11/07/24 1438 98 %     Weight --      Height --      Head Circumference --      Peak Flow --      Pain Score 11/07/24 1431 0     Pain Loc --      Pain Education --      Exclude from Growth Chart --     Most recent vital signs: Vitals:   11/07/24 1438  BP: (!) 155/88  Pulse: (!) 135  Resp: (!) 25  Temp: 97.7 F (36.5 C)  SpO2: 98%     General: Awake, no distress.  CV:  Good peripheral perfusion.  Tachycardic Resp:  Normal effort.  Coarse breath sound Abd:  No distention.  Soft and nontender Other:  No significant swelling noted   ED  Results / Procedures / Treatments   Labs (all labs ordered are listed, but only abnormal results are displayed) Labs Reviewed  CULTURE, BLOOD (ROUTINE X 2)  CULTURE, BLOOD (ROUTINE X 2)  RESP PANEL BY RT-PCR (RSV, FLU A&B, COVID)  [id number]  COMPREHENSIVE METABOLIC PANEL WITH GFR  LACTIC ACID, PLASMA  LACTIC ACID, PLASMA  CBC WITH DIFFERENTIAL/PLATELET  PROTIME-INR  URINALYSIS, W/ REFLEX TO CULTURE (INFECTION SUSPECTED)     EKG  My interpretation of EKG:  Sinus tachycardia rate 131 without any ST elevation or T wave versions, normal  RADIOLOGY I have reviewed the xray personally and interpreted prior right pneumonectomy   PROCEDURES:  Critical Care performed: Yes, see critical care procedure note(s)  .1-3 Lead EKG Interpretation  Performed by: Ernest Ronal BRAVO, MD Authorized by: Ernest Ronal BRAVO, MD     Interpretation: abnormal     ECG rate:  118   ECG rate assessment: tachycardic     Rhythm:  sinus tachycardia     Ectopy: none     Conduction: normal   .Critical Care  Performed by: Ernest Ronal BRAVO, MD Authorized by: Ernest Ronal BRAVO, MD   Critical care provider statement:    Critical care time (minutes):  30   Critical care was necessary to treat or prevent imminent or life-threatening deterioration of the following conditions:  Respiratory failure   Critical care was time spent personally by me on the following activities:  Development of treatment plan with patient or surrogate, discussions with consultants, evaluation of patient's response to treatment, examination of patient, ordering and review of laboratory studies, ordering and review of radiographic studies, ordering and performing treatments and interventions, pulse oximetry, re-evaluation of patient's condition and review of old charts    MEDICATIONS ORDERED IN ED: Medications  vancomycin  (VANCOCIN ) IVPB 1000 mg/200 mL premix (1,000 mg Intravenous New Bag/Given 11/07/24 1557)  lactated ringers  infusion (has no  administration in time range)  enoxaparin  (LOVENOX ) injection 40 mg (has no administration in time range)  acetaminophen  (TYLENOL ) tablet 650 mg (has no administration in time range)    Or  acetaminophen  (TYLENOL ) suppository 650 mg (has no administration in time range)  oxyCODONE  (Oxy IR/ROXICODONE ) immediate release tablet 5 mg (has no administration in time range)  ondansetron  (ZOFRAN ) tablet 4 mg (has no administration in time range)    Or  ondansetron  (ZOFRAN ) injection 4 mg (has no administration in time range)  albuterol  (PROVENTIL ) (2.5 MG/3ML) 0.083% nebulizer solution 2.5 mg (has no administration in time range)  sodium chloride  0.9 % bolus 1,000 mL (0 mLs Intravenous Stopped 11/07/24 1632)  ceFEPIme  (MAXIPIME ) 2 g in sodium chloride  0.9 % 100 mL IVPB (0 g Intravenous Stopped 11/07/24 1550)  doxycycline  (VIBRA -TABS) tablet 100 mg (100 mg Oral Given 11/07/24 1555)     IMPRESSION / MDM / ASSESSMENT AND PLAN / ED COURSE  I reviewed the triage vital signs and the nursing notes.   Patient's presentation is most consistent with acute presentation with potential threat to life or bodily function.   Differential suspect this is most likely pneumonia versus COVID versus flu.  Patient on increased oxygen requirements and tachycardic no significant signs of fluid overload on examination so we will start up with 1 L of fluid while awaiting workup to further evaluate other causes.  If chest x-ray is negative and viral studies are negative also will consider pulmonary embolism but patient denies any history of this has no evidence of DVT on examination and reports that this feels consistent with his prior episodes of pneumonia  Patient's flu test was positive.  Patient is on increased oxygen of 5 L.  His CMP is reassuring lactate is slightly elevated patient is getting some IV fluids white count was elevated chest x-ray without any obvious pneumonia but I had already covered patient with  broad-spectrum antibiotics due to my concerns for potential pneumonia.  BNP and troponin slightly elevated more likely demand.  Discussed with patient admission to the hospital.  Have given 1 L of fluid and heart rates of come down slightly.  Do not want to fluid overload him.  I suspect this is most likely related to respiratory failure secondary to influenza.   The patient is on the cardiac monitor to evaluate for evidence of arrhythmia and/or significant heart rate changes.      FINAL CLINICAL IMPRESSION(S) / ED DIAGNOSES   Final diagnoses:  Tachycardia  Influenza  Acute respiratory failure with hypoxia (HCC)  Rx / DC Orders   ED Discharge Orders     None        Note:  This document was prepared using Dragon voice recognition software and may include unintentional dictation errors.   Ernest Ronal BRAVO, MD 11/07/24 [phone]  "

## 2024-11-07 NOTE — H&P (Addendum)
 " History and Physical    Jaizon Walker FMW:[id number] DOB: April 16, 1961 DOA: 11/07/2024  PCP: [facility]  Patient coming from: Home  I have personally briefly reviewed patient's old medical records in [facility]  Chief Complaint: Shortness of breath since 1 week  HPI: Geoffrey Walker is a 63 y.o. male with medical history significant of lung cancer status post pneumonectomy, COPD, chronic hypoxemic respiratory failure in the setting of COPD on 2 L of oxygen via nasal cannula, hypertension, chronic venous stasis, tobacco abuse presented with worsening shortness of breath since 1 week  Patient reports that he has been sick with cough, shortness of breath, wheezing since 1 week.  No fever, chills, headache, nausea, vomiting, chest pain, recent sick contact, decreased appetite, generalized weakness or lethargy.  Reports that he uses 2 L of oxygen via nasal cannula at baseline.  He lives with his friend at home.  Smokes 1 to 2 cigarettes/day, denies alcohol, illicit drug use.  ED Course: Upon arrival to ED: Patient afebrile, tachycardic with pulse of 135, RR 25, BP 155/88, on 3 L of oxygen via nasal cannula.  Troponin 43.  NA: 140, K: 3.2, BG: 118, normal liver and kidney function.  CBC shows leukocytosis of 15.4, H&H 12.4/38.4, PLT 281.  Flu a positive..  Lactic acid 2.5.  Code sepsis activated.  Chest x-ray shows complete opacification of right hemithorax consistent with provided history of total pneumonectomy.  BNP: 6888.0.  No acute cardiopulmonary process.  Patient was given broad-spectrum antibiotics Vanco, cefepime  and doxy as an IV fluid bolus.  [facility] hospitalist consulted for admission  Review of Systems: As per HPI otherwise negative.    Past Medical History:  Diagnosis Date   High cholesterol    Hypertension    Pneumonia     Past Surgical History:  Procedure Laterality Date   BACK SURGERY     CHEST TUBE INSERTION     LEG SURGERY Right      reports that he has  been smoking cigarettes. He does not have any smokeless tobacco history on file. He reports that he does not currently use alcohol after a past usage of about 24.0 standard drinks of alcohol per week. He reports that he does not use drugs.  Allergies[1]  History reviewed. No pertinent family history.  Prior to Admission medications  Medication Sig Start Date End Date Taking? Authorizing Provider  albuterol  (PROVENTIL  HFA;VENTOLIN  HFA) 108 (90 Base) MCG/ACT inhaler Inhale 2 puffs into the lungs every 4 (four) hours as needed for wheezing or shortness of breath. 02/04/16   Pasco Alexandria, MD  atorvastatin (LIPITOR) 40 MG tablet Take 40 mg by mouth daily.    [provider]  carvedilol  (COREG ) 12.5 MG tablet Take 12.5 mg by mouth 2 (two) times daily with a meal.    [provider]  cholecalciferol (VITAMIN D) 1000 units tablet Take 1,000 Units by mouth daily.    [provider]  cyclobenzaprine  (FLEXERIL ) 5 MG tablet 1 tablet every 8 hours as needed for muscle spasms 03/03/24   Sung, Jade J, MD  hydrochlorothiazide (HYDRODIURIL) 25 MG tablet Take 25 mg by mouth daily.    [provider]  HYDROmorphone  (DILAUDID ) 2 MG tablet Take 1 tablet (2 mg total) by mouth every 6 (six) hours as needed for severe pain (pain score 7-10). 03/03/24   Sung, Jade J, MD  Multiple Vitamin (MULTIVITAMIN) capsule Take 1 capsule by mouth daily.    [provider]  vitamin B-12 (  CYANOCOBALAMIN) 50 MCG tablet Take 50 mcg by mouth daily.    [provider]  vitamin E 400 UNIT capsule Take 400 Units by mouth daily.    [provider]    Physical Exam: Vitals:   11/07/24 1438 11/07/24 1507 11/07/24 1530  BP: (!) 155/88  (!) 149/84  Pulse: (!) 135  (!) 118  Resp: (!) 25  (!) 25  Temp: 97.7 F (36.5 C)    TempSrc: Oral    SpO2: 98%  99%  Weight:  85.8 kg   Height:  5' 10 (1.778 m)     Constitutional: NAD, calm, comfortable, on nasal cannula,  communicating well Eyes: PERRL, lids and conjunctivae normal ENMT: Mucous membranes are moist. Posterior pharynx clear of any exudate or lesions.Normal dentition.  Neck: normal, supple, no masses, no thyromegaly Respiratory:  diffuse  rhonchi and expiratory wheezing noted. Cardiovascular: Regular rate and rhythm, no murmurs / rubs / gallops. No extremity edema. 2+ pedal pulses. No carotid bruits.  Abdomen: no tenderness, no masses palpated. No hepatosplenomegaly. Bowel sounds positive.  Musculoskeletal: no clubbing / cyanosis. No joint deformity upper and lower extremities. Good ROM, no contractures. Normal muscle tone.  Skin: no rashes, lesions, ulcers. No induration Neurologic: CN 2-12 grossly intact. Sensation intact, DTR normal. Strength 5/5 in all 4.  Psychiatric: Normal judgment and insight. Alert and oriented x 3. Normal mood.    Labs on Admission: I have personally reviewed following labs and imaging studies  CBC: Recent Labs  Lab 11/07/24 1450  WBC 15.4*  NEUTROABS 12.1*  HGB 12.4*  HCT 38.4*  MCV 90.6  PLT 281   Basic Metabolic Panel: Recent Labs  Lab 11/07/24 1450  NA 140  K 3.2*  CL 95*  CO2 30  GLUCOSE 118*  BUN 13  CREATININE 0.86  CALCIUM  9.0   GFR: Estimated Creatinine Clearance: 93.1 mL/min (by C-G formula based on SCr of 0.86 mg/dL). Liver Function Tests: Recent Labs  Lab 11/07/24 1450  AST 18  ALT 7  ALKPHOS 105  BILITOT 0.7  PROT 7.3  ALBUMIN 3.2*   No results for input(s): LIPASE, AMYLASE in the last 168 hours. No results for input(s): AMMONIA in the last 168 hours. Coagulation Profile: Recent Labs  Lab 11/07/24 1450  INR 1.1   Cardiac Enzymes: No results for input(s): CKTOTAL, CKMB, CKMBINDEX, TROPONINI in the last 168 hours. BNP (last 3 results) Recent Labs    11/07/24 1450  PROBNP 6,888.0*   HbA1C: No results for input(s): HGBA1C in the last 72 hours. CBG: No results for input(s): GLUCAP in the last 168  hours. Lipid Profile: No results for input(s): CHOL, HDL, LDLCALC, TRIG, CHOLHDL, LDLDIRECT in the last 72 hours. Thyroid Function Tests: No results for input(s): TSH, T4TOTAL, FREET4, T3FREE, THYROIDAB in the last 72 hours. Anemia Panel: No results for input(s): VITAMINB12, FOLATE, FERRITIN, TIBC, IRON, RETICCTPCT in the last 72 hours. Urine analysis:    Component Value Date/Time   COLORURINE AMBER (A) 11/07/2024 1450   APPEARANCEUR HAZY (A) 11/07/2024 1450   LABSPEC 1.030 11/07/2024 1450   PHURINE 6.0 11/07/2024 1450   GLUCOSEU NEGATIVE 11/07/2024 1450   HGBUR NEGATIVE 11/07/2024 1450   BILIRUBINUR MODERATE (A) 11/07/2024 1450   KETONESUR 20 (A) 11/07/2024 1450   PROTEINUR >=300 (A) 11/07/2024 1450   NITRITE NEGATIVE 11/07/2024 1450   LEUKOCYTESUR NEGATIVE 11/07/2024 1450    Radiological Exams on Admission: DG Chest Port 1 View Result Date: 11/07/2024 CLINICAL DATA:  Shortness of breath. Status  post RIGHT pneumonectomy 5 years ago. EXAM: PORTABLE CHEST - 1 VIEW COMPARISON:  02/04/2016 FINDINGS: Complete opacification of the RIGHT hemithorax with RIGHT mediastinal shift consistent with provided history of a total pneumonectomy. Mild scattered atelectasis of the LEFT lung without focal airspace opacity indicative pneumonia. IMPRESSION: 1. Complete opacification of the RIGHT hemithorax consistent with provided history of total pneumonectomy. 2. No acute cardiopulmonary process. Electronically Signed   By: Aliene Lloyd M.D.   On: 11/07/2024 16:16    EKG: Independently reviewed. Sinus tachycardia, prolonged QT interval  Assessment/Plan  Acute on chronic hypoxemic respiratory failure in the setting of influenza A: Severe sepsis in the setting of influenza A: - Patient met severe septic criteria upon arrival with tachycardia, tachypnea, leukocytosis and lactic acidosis.  He is on 2 L of oxygen via nasal cannula requiring 5 L in the ER. - He is  afebrile.  Influenza A positive.  Chest x-ray negative for pneumonia.  He received broad-spectrum antibiotics.  He is very congested. - Will admit patient at stepdown unit.  On continuous pulse ox.  Will try to wean off of oxygen as tolerated.  Start scheduled nebulizer treatment. - Will trend lactic acid.  Will hold off broader spectrum antibiotics for now.  He is out of Tamiflu window.  Blood culture obtained and is pending.  Will continue IV fluids. - Monitor vitals closely  Elevated troponin: Elevated BNP - Troponin flat.  Likely demand ischemia in the setting of sepsis.  Will trend troponin.  History of lung cancer status post right pneumonectomy: - Aware.  Hypokalemia:  Hypophosphatemia: Replenished.  Repeat labs tomorrow.  Hypertension: - Will resume Coreg , will hold off hydrochlorothiazide - Monitor blood pressure closely  Hyperlipidemia: Continue statin  Tobacco abuse: - Reports he smokes 1 to 2 cigarettes/day.  Recommend cessation  DVT prophylaxis: Lovenox  Code Status: Full code Family Communication: None present at bedside.  Plan of care discussed with patient in length and he verbalized understanding and agreed with it. Disposition Plan: To be determined Consults called: None Admission status: Inpatient   Velna JONELLE Skeeter MD [facility]  If 7PM-7AM, please contact night-coverage [email or web address]  11/07/2024, 4:44 PM        [1]  Allergies Allergen Reactions   Penicillins     unsure   "

## 2024-11-07 NOTE — ED Triage Notes (Signed)
 Pt from home via EMS due to shortness of breath for the past week. Pt with history of right lung removal 5 years ago. Rhonchi and wheezing heard on right lung. Pt on 2L of oxygen at home. Pt increase oxygen to 5L at home with no relief. Pt with history of hypertension, non compliant with medication. Pt in no acute distress at this time.

## 2024-11-07 NOTE — Consult Note (Signed)
 CODE SEPSIS - PHARMACY COMMUNICATION  **Broad Spectrum Antibiotics should be administered within 1 hour of Sepsis diagnosis**  Time Code Sepsis Called/Page Received: 1511  Antibiotics Ordered: cefepime , vancomycin , doxycylcine  Time of 1st antibiotic administration: 1520  Additional action taken by pharmacy: none  If necessary, Name of Provider/Nurse Contacted: n/a    Annabella LOISE Banks ,PharmD Clinical Pharmacist  11/07/2024  3:18 PM

## 2024-11-08 DIAGNOSIS — J9621 Acute and chronic respiratory failure with hypoxia: Secondary | ICD-10-CM | POA: Diagnosis not present

## 2024-11-08 LAB — COMPREHENSIVE METABOLIC PANEL WITH GFR
ALT: 5 U/L (ref 0–44)
AST: 16 U/L (ref 15–41)
Albumin: 2.7 g/dL — ABNORMAL LOW (ref 3.5–5.0)
Alkaline Phosphatase: 77 U/L (ref 38–126)
Anion gap: 9 (ref 5–15)
BUN: 15 mg/dL (ref 8–23)
CO2: 30 mmol/L (ref 22–32)
Calcium: 8.5 mg/dL — ABNORMAL LOW (ref 8.9–10.3)
Chloride: 99 mmol/L (ref 98–111)
Creatinine, Ser: 0.75 mg/dL (ref 0.61–1.24)
GFR, Estimated: >60 mL/min
Glucose, Bld: 173 mg/dL — ABNORMAL HIGH (ref 70–99)
Potassium: 4.4 mmol/L (ref 3.5–5.1)
Sodium: 138 mmol/L (ref 135–145)
Total Bilirubin: 0.4 mg/dL (ref 0.0–1.2)
Total Protein: 6.3 g/dL — ABNORMAL LOW (ref 6.5–8.1)

## 2024-11-08 LAB — PROTIME-INR
INR: 1.2 (ref 0.8–1.2)
Prothrombin Time: 15.8 s — ABNORMAL HIGH (ref 11.4–15.2)

## 2024-11-08 LAB — CBC
HCT: 32.8 % — ABNORMAL LOW (ref 39.0–52.0)
Hemoglobin: 10.4 g/dL — ABNORMAL LOW (ref 13.0–17.0)
MCH: 28.9 pg (ref 26.0–34.0)
MCHC: 31.7 g/dL (ref 30.0–36.0)
MCV: 91.1 fL (ref 80.0–100.0)
Platelets: 226 K/uL (ref 150–400)
RBC: 3.6 MIL/uL — ABNORMAL LOW (ref 4.22–5.81)
RDW: 12.9 % (ref 11.5–15.5)
WBC: 3.9 K/uL — ABNORMAL LOW (ref 4.0–10.5)
nRBC: 0 % (ref 0.0–0.2)

## 2024-11-08 LAB — PHOSPHORUS: Phosphorus: 3.8 mg/dL (ref 2.5–4.6)

## 2024-11-08 LAB — HIV ANTIBODY (ROUTINE TESTING W REFLEX): HIV Screen 4th Generation wRfx: NONREACTIVE

## 2024-11-08 LAB — TROPONIN T, HIGH SENSITIVITY: Troponin T High Sensitivity: 32 ng/L — ABNORMAL HIGH (ref 0–19)

## 2024-11-08 LAB — CORTISOL-AM, BLOOD: Cortisol - AM: 18.7 ug/dL (ref 6.7–22.6)

## 2024-11-08 MED ORDER — TRAZODONE HCL 50 MG PO TABS
25.0000 mg | ORAL_TABLET | Freq: Once | ORAL | Status: AC
  Administered 2024-11-08: 25 mg via ORAL
  Filled 2024-11-08: qty 1

## 2024-11-08 MED ORDER — TRAZODONE HCL 50 MG PO TABS
25.0000 mg | ORAL_TABLET | Freq: Every evening | ORAL | Status: DC | PRN
  Administered 2024-11-08 – 2024-11-26 (×17): 25 mg via ORAL
  Filled 2024-11-08 (×13): qty 1

## 2024-11-08 MED ORDER — PANTOPRAZOLE SODIUM 40 MG PO TBEC
40.0000 mg | DELAYED_RELEASE_TABLET | Freq: Every day | ORAL | Status: DC
  Administered 2024-11-08 – 2024-11-12 (×5): 40 mg via ORAL
  Filled 2024-11-08: qty 1

## 2024-11-08 MED ORDER — IPRATROPIUM-ALBUTEROL 0.5-2.5 (3) MG/3ML IN SOLN
3.0000 mL | Freq: Four times a day (QID) | RESPIRATORY_TRACT | Status: DC
  Administered 2024-11-08 – 2024-11-09 (×4): 3 mL via RESPIRATORY_TRACT
  Filled 2024-11-08: qty 3

## 2024-11-08 MED ORDER — MENTHOL 3 MG MT LOZG
1.0000 | LOZENGE | OROMUCOSAL | Status: DC | PRN
  Administered 2024-11-08 – 2024-11-11 (×5): 3 mg via ORAL
  Filled 2024-11-08: qty 9

## 2024-11-08 MED ORDER — METHYLPREDNISOLONE SODIUM SUCC 40 MG IJ SOLR
40.0000 mg | Freq: Two times a day (BID) | INTRAMUSCULAR | Status: DC
  Administered 2024-11-08 – 2024-11-10 (×4): 40 mg via INTRAVENOUS
  Filled 2024-11-08: qty 1

## 2024-11-08 NOTE — ED Notes (Addendum)
 This tech was obtaining pt vitals and readjusted his blood pressure cuff, when doing so this tech noticed that pt IV in his left AC was completely out. Pt stated I'm sure it is, I told them not to put it right there.' No active bleeding but dry blood around site, this tech placed gauze over IV site. RN Penne made aware.

## 2024-11-08 NOTE — ED Notes (Signed)
 Pt had ordered 25mg  of trazodone  for this pt to help him sleep. Pt stated that did not work and that he would like the other half of the pill. As we have 50mg  pills, he only got half of it.  I tried to provide education about how It had nothing to do with half a pill, but the dosage. RN reached out to provider.

## 2024-11-08 NOTE — Progress Notes (Addendum)
 " PROGRESS NOTE    Geoffrey Walker  FMW:[id number] DOB: 23-Oct-1961 DOA: 11/07/2024 PCP: [facility]  Chief Complaint  Patient presents with   Shortness of Breath    Hospital Course:  Geoffrey Walker is a 63 y.o. male with medical history significant of lung cancer status post pneumonectomy, COPD, chronic hypoxemic respiratory failure in the setting of COPD on 2 L of oxygen via nasal cannula, hypertension, chronic venous stasis, tobacco abuse presented with worsening shortness of breath since 1 week.  Reports being sick with SOB and wheezing x 1 week.  Workup revealed influenza positive, admitted for acute on chronic hypoxic respiratory failure.  Hospital course as below  Subjective: Patient was examined at the bedside, new to me today  Reports feeling SOB, and is having b/l wheezing Will add IV Solumedrol, PPI Discontinue IV fluids  Objective: Vitals:   11/08/24 0430 11/08/24 0500 11/08/24 0630 11/08/24 1025  BP: 122/79 123/72  132/83  Pulse: 83 79  83  Resp: 14 (!) 8  20  Temp:   98.3 F (36.8 C) (!) 97.4 F (36.3 C)  TempSrc:   Oral Oral  SpO2: 100% 100%  100%  Weight:      Height:       No intake or output data in the 24 hours ending 11/08/24 1101 Filed Weights   11/07/24 1507  Weight: 85.8 kg    Examination: Constitutional: NAD, calm, comfortable, on nasal cannula, able to complete sentences Neck: normal, supple, no masses, no thyromegaly Respiratory:  diffuse  rhonchi and expiratory wheezing noted. Cardiovascular: Regular rate and rhythm, no murmurs / rubs / gallops. No extremity edema. 2+ pedal pulses. No carotid bruits.  Abdomen: no tenderness, no masses palpated. No hepatosplenomegaly. Bowel sounds positive.  Musculoskeletal: no clubbing / cyanosis. No joint deformity upper and lower extremities. Good ROM, no contractures. Normal muscle tone.  Skin: no rashes, lesions, ulcers. No induration Neurologic: CN 2-12 grossly intact. Sensation intact, DTR normal.  Strength 5/5 in all 4.  Psychiatric: Normal judgment and insight. Alert and oriented x 3. Normal mood  Assessment & Plan:  Acute on chronic hypoxemic respiratory failure in the setting of influenza A Severe sepsis in the setting of influenza A Met severe sepsis criteria upon arrival with tachycardia, tachypnea, leukocytosis and lactic acidosis.   He is on 2 L of oxygen via nasal cannula requiring 5 LNC now Lactic acidosis resolved Chest x-ray negative for pneumonia, no indication for antibiotics Out of window for Tamiflu IV Solumedrol, PPI, Duonebs q6 Check procalcitonin, Follow up blood cultures  Elevated troponin Elevated BNP - Troponin flat.  Likely demand ischemia in the setting of sepsis - Downtrended   History of lung cancer status post right pneumonectomy: Follow up with Oncology outpatient   Hypokalemia:  Hypophosphatemia Repleted  Hypertension: On Coreg , will hold off hydrochlorothiazide  Hyperlipidemia: Continue statin   Tobacco abuse: - Reports he smokes 1 to 2 cigarettes/day.  Recommend cessation    DVT prophylaxis: Lovenox  SQ   Code Status: Full Code Disposition:  Home  Consultants:  None  Procedures:  None  Antimicrobials:  Anti-infectives (From admission, onward)    Start     Dose/Rate Route Frequency Ordered Stop   11/07/24 1515  vancomycin  (VANCOCIN ) IVPB 1000 mg/200 mL premix        1,000 mg 200 mL/hr over 60 Minutes Intravenous  Once 11/07/24 1511 11/07/24 1702   11/07/24 1515  ceFEPIme  (MAXIPIME ) 2 g in sodium chloride  0.9 % 100 mL IVPB  2 g 200 mL/hr over 30 Minutes Intravenous  Once 11/07/24 1511 11/07/24 1550   11/07/24 1515  doxycycline  (VIBRA -TABS) tablet 100 mg        100 mg Oral  Once 11/07/24 1511 11/07/24 1555       Data Reviewed: I have personally reviewed following labs and imaging studies CBC: Recent Labs  Lab 11/07/24 1450 11/07/24 1654 11/08/24 0430  WBC 15.4* 12.6* 3.9*  NEUTROABS 12.1*  --   --   HGB  12.4* 11.7* 10.4*  HCT 38.4* 36.4* 32.8*  MCV 90.6 90.1 91.1  PLT 281 235 226   Basic Metabolic Panel: Recent Labs  Lab 11/07/24 1450 11/07/24 1654 11/08/24 0430  NA 140  --  138  K 3.2*  --  4.4  CL 95*  --  99  CO2 30  --  30  GLUCOSE 118*  --  173*  BUN 13  --  15  CREATININE 0.86  --  0.75  CALCIUM  9.0  --  8.5*  MG  --  1.7  --   PHOS  --  1.8* 3.8   GFR: Estimated Creatinine Clearance: 100.1 mL/min (by C-G formula based on SCr of 0.75 mg/dL). Liver Function Tests: Recent Labs  Lab 11/07/24 1450 11/08/24 0430  AST 18 16  ALT 7 5  ALKPHOS 105 77  BILITOT 0.7 0.4  PROT 7.3 6.3*  ALBUMIN 3.2* 2.7*   CBG: Recent Labs  Lab 11/07/24 2158  GLUCAP 162*    Recent Results (from the past 240 hours)  Culture, blood (Routine x 2)     Status: None (Preliminary result)   Collection Time: 11/07/24  2:50 PM   Specimen: BLOOD  Result Value Ref Range Status   Specimen Description BLOOD BLOOD RIGHT WRIST  Final   Special Requests   Final    BOTTLES DRAWN AEROBIC AND ANAEROBIC Blood Culture results may not be optimal due to an inadequate volume of blood received in culture bottles   Culture   Final    NO GROWTH < 24 HOURS Performed at [facility], [address], [place], [place] [address]    Report Status PENDING  Incomplete  Culture, blood (Routine x 2)     Status: None (Preliminary result)   Collection Time: 11/07/24  2:50 PM   Specimen: BLOOD  Result Value Ref Range Status   Specimen Description BLOOD BLOOD RIGHT FOREARM  Final   Special Requests   Final    BOTTLES DRAWN AEROBIC AND ANAEROBIC Blood Culture adequate volume   Culture   Final    NO GROWTH < 24 HOURS Performed at [facility], [address] [place], [place] [address]    Report Status PENDING  Incomplete  Resp panel by RT-PCR (RSV, Flu A&B, Covid) Anterior Nasal Swab     Status: Abnormal   Collection Time: 11/07/24  2:53 PM   Specimen: Anterior Nasal Swab  Result Value Ref  Range Status   SARS Coronavirus 2 by RT PCR NEGATIVE NEGATIVE Final    Comment: (NOTE) SARS-CoV-2 target nucleic acids are NOT DETECTED.  The SARS-CoV-2 RNA is generally detectable in upper respiratory specimens during the acute phase of infection. The lowest concentration of SARS-CoV-2 viral copies this assay can detect is 138 copies/mL. A negative result does not preclude SARS-Cov-2 infection and should not be used as the sole basis for treatment or other patient management decisions. A negative result may occur with  improper specimen collection/handling, submission of specimen other than  nasopharyngeal swab, presence of viral mutation(s) within the areas targeted by this assay, and inadequate number of viral copies(<138 copies/mL). A negative result must be combined with clinical observations, patient history, and epidemiological information. The expected result is Negative.  Fact Sheet for Patients:  bloggercourse.com  Fact Sheet for Healthcare Providers:  seriousbroker.it  This test is no t yet approved or cleared by the [place]  FDA and  has been authorized for detection and/or diagnosis of SARS-CoV-2 by FDA under an Emergency Use Authorization (EUA). This EUA will remain  in effect (meaning this test can be used) for the duration of the COVID-19 declaration under Section 564(b)(1) of the Act, 21 U.S.C.section 360bbb-3(b)(1), unless the authorization is terminated  or revoked sooner.       Influenza A by PCR POSITIVE (A) NEGATIVE Final   Influenza B by PCR NEGATIVE NEGATIVE Final    Comment: (NOTE) The Xpert Xpress SARS-CoV-2/FLU/RSV plus assay is intended as an aid in the diagnosis of influenza from Nasopharyngeal swab specimens and should not be used as a sole basis for treatment. Nasal washings and aspirates are unacceptable for Xpert Xpress SARS-CoV-2/FLU/RSV testing.  Fact Sheet for  Patients: bloggercourse.com  Fact Sheet for Healthcare Providers: seriousbroker.it  This test is not yet approved or cleared by the [place]  FDA and has been authorized for detection and/or diagnosis of SARS-CoV-2 by FDA under an Emergency Use Authorization (EUA). This EUA will remain in effect (meaning this test can be used) for the duration of the COVID-19 declaration under Section 564(b)(1) of the Act, 21 U.S.C. section 360bbb-3(b)(1), unless the authorization is terminated or revoked.     Resp Syncytial Virus by PCR NEGATIVE NEGATIVE Final    Comment: (NOTE) Fact Sheet for Patients: bloggercourse.com  Fact Sheet for Healthcare Providers: seriousbroker.it  This test is not yet approved or cleared by the [place]  FDA and has been authorized for detection and/or diagnosis of SARS-CoV-2 by FDA under an Emergency Use Authorization (EUA). This EUA will remain in effect (meaning this test can be used) for the duration of the COVID-19 declaration under Section 564(b)(1) of the Act, 21 U.S.C. section 360bbb-3(b)(1), unless the authorization is terminated or revoked.  Performed at [facility], [address], [place], [place] [address]      Radiology Studies: [facility] Chest [place] 1 View Result Date: 11/07/2024 CLINICAL DATA:  Shortness of breath. Status post RIGHT pneumonectomy 5 years ago. EXAM: PORTABLE CHEST - 1 VIEW COMPARISON:  02/04/2016 FINDINGS: Complete opacification of the RIGHT hemithorax with RIGHT mediastinal shift consistent with provided history of a total pneumonectomy. Mild scattered atelectasis of the LEFT lung without focal airspace opacity indicative pneumonia. IMPRESSION: 1. Complete opacification of the RIGHT hemithorax consistent with provided history of total pneumonectomy. 2. No acute cardiopulmonary process. Electronically Signed   By: Aliene Lloyd  M.D.   On: 11/07/2024 16:16    Scheduled Meds:  albuterol   2.5 mg Nebulization Q4H   carvedilol   12.5 mg Oral BID WC   enoxaparin  (LOVENOX ) injection  40 mg Subcutaneous Q24H   rosuvastatin   5 mg Oral Daily   Continuous Infusions:  lactated ringers  Stopped (11/08/24 0157)     LOS: 1 day  MDM: Patient is high risk for one or more organ failure.  They necessitate ongoing hospitalization for continued IV therapies and subsequent lab monitoring. Total time spent interpreting labs and vitals, reviewing the medical record, coordinating care amongst consultants and care team members, directly assessing and discussing care with the patient  and/or family: 55 min Laree Lock, MD [facility]  To contact the attending physician between 7A-7P please use Epic Chat. To contact the covering physician during after hours 7P-7A, please review Amion.  11/08/2024, 11:01 AM   *This document has been created with the assistance of dictation software. Please excuse typographical errors. *   "

## 2024-11-08 NOTE — ED Notes (Signed)
 Provider reached out to about pt's request for medication to help him sleep while he is admitted here. Pt stated he does not normally take anything but has been having trouble sleeping since being sick.

## 2024-11-09 DIAGNOSIS — J9621 Acute and chronic respiratory failure with hypoxia: Secondary | ICD-10-CM | POA: Diagnosis not present

## 2024-11-09 LAB — BASIC METABOLIC PANEL WITH GFR
Anion gap: 8 (ref 5–15)
BUN: 20 mg/dL (ref 8–23)
CO2: 30 mmol/L (ref 22–32)
Calcium: 8.5 mg/dL — ABNORMAL LOW (ref 8.9–10.3)
Chloride: 99 mmol/L (ref 98–111)
Creatinine, Ser: 0.85 mg/dL (ref 0.61–1.24)
GFR, Estimated: >60 mL/min
Glucose, Bld: 177 mg/dL — ABNORMAL HIGH (ref 70–99)
Potassium: 4.2 mmol/L (ref 3.5–5.1)
Sodium: 137 mmol/L (ref 135–145)

## 2024-11-09 LAB — CBC
HCT: 32.1 % — ABNORMAL LOW (ref 39.0–52.0)
Hemoglobin: 10.4 g/dL — ABNORMAL LOW (ref 13.0–17.0)
MCH: 29.4 pg (ref 26.0–34.0)
MCHC: 32.4 g/dL (ref 30.0–36.0)
MCV: 90.7 fL (ref 80.0–100.0)
Platelets: 234 K/uL (ref 150–400)
RBC: 3.54 MIL/uL — ABNORMAL LOW (ref 4.22–5.81)
RDW: 13 % (ref 11.5–15.5)
WBC: 4.9 K/uL (ref 4.0–10.5)
nRBC: 0 % (ref 0.0–0.2)

## 2024-11-09 LAB — MAGNESIUM: Magnesium: 1.8 mg/dL (ref 1.7–2.4)

## 2024-11-09 LAB — PHOSPHORUS: Phosphorus: 2.9 mg/dL (ref 2.5–4.6)

## 2024-11-09 LAB — PROCALCITONIN: Procalcitonin: <0.1 ng/mL

## 2024-11-09 MED ORDER — TRAZODONE HCL 50 MG PO TABS
50.0000 mg | ORAL_TABLET | Freq: Once | ORAL | Status: AC
  Administered 2024-11-09: 50 mg via ORAL
  Filled 2024-11-09: qty 1

## 2024-11-09 MED ORDER — IPRATROPIUM-ALBUTEROL 0.5-2.5 (3) MG/3ML IN SOLN
3.0000 mL | Freq: Three times a day (TID) | RESPIRATORY_TRACT | Status: DC
  Administered 2024-11-09 – 2024-11-17 (×24): 3 mL via RESPIRATORY_TRACT
  Filled 2024-11-09 (×24): qty 3

## 2024-11-09 NOTE — Progress Notes (Signed)
 PT Cancellation Note  Patient Details Name: Geoffrey Walker MRN: [id number] DOB: 1961/02/19   Cancelled Treatment:    Reason Eval/Treat Not Completed: Patient declined, no reason specified (Consult received and chart reviewed. Patient declining participation with session, stating I'm not breathing good today.  Unable to redirect despite education, encouragement; will continue efforts next date as appropriate.)  [name]. Delores, PT, DPT, NCS 11/09/2024, 2:28 PM [phone]

## 2024-11-09 NOTE — Progress Notes (Addendum)
 " PROGRESS NOTE    Geoffrey Walker  FMW:[id number] DOB: [birth year] DOA: 11/07/2024 PCP: [facility]  Chief Complaint  Patient presents with   Shortness of Breath    Hospital Course:  Geoffrey Walker is a 63 y.o. male with medical history significant of lung cancer status post pneumonectomy, COPD, chronic hypoxemic respiratory failure in the setting of COPD on 2 L of oxygen via nasal cannula, hypertension, chronic venous stasis, tobacco abuse presented with worsening shortness of breath since 1 week.  Reports being sick with SOB and wheezing x 1 week.  Workup revealed influenza positive, admitted for acute on chronic hypoxic respiratory failure.  Hospital course as below  Subjective: Patient was examined at the bedside, reports doing better today  Continues to have SOB, states have difficulty breathing when we wean O2 though saturating 99 to 100% Status post pneumonectomy PT/OT eval  Objective: Vitals:   11/09/24 0900 11/09/24 1000 11/09/24 1121 11/09/24 1300  BP: 109/65 (!) 102/57  115/69  Pulse: 72 73  75  Resp:  15  18  Temp:   97.6 F (36.4 C) 97.9 F (36.6 C)  TempSrc:   Oral   SpO2: 100% 100%  99%  Weight:      Height:        Intake/Output Summary (Last 24 hours) at 11/09/2024 1714 Last data filed at 11/09/2024 1000 Gross per 24 hour  Intake 240 ml  Output --  Net 240 ml   Filed Weights   11/07/24 1507  Weight: 85.8 kg    Examination: Constitutional: NAD, calm, comfortable, on nasal cannula, able to complete sentences Neck: normal, supple, no masses, no thyromegaly Respiratory: Diffuse rhonchi and minimal bilateral wheezing Cardiovascular: Regular rate and rhythm, no murmurs / rubs / gallops. No extremity edema. 2+ pedal pulses. No carotid bruits.  Abdomen: no tenderness, no masses palpated. No hepatosplenomegaly. Bowel sounds positive.  Musculoskeletal: no clubbing / cyanosis. No joint deformity upper and lower extremities. Good ROM, no contractures.  Normal muscle tone.  Skin: no rashes, lesions, ulcers. No induration Neurologic: CN 2-12 grossly intact. Sensation intact, DTR normal. Strength 5/5 in all 4.  Psychiatric: Normal judgment and insight. Alert and oriented x 3. Normal mood  Assessment & Plan:  Acute on chronic hypoxemic respiratory failure in the setting of influenza A Severe sepsis in the setting of influenza A He is on 2 L of oxygen via nasal cannula requiring 4 LNC now Lactic acidosis resolved Chest x-ray negative for pneumonia, no indication for antibiotics Out of window for Tamiflu IV Solumedrol, PPI, Duonebs q6 Follow up blood cultures Wean oxygen as able  Elevated troponin Elevated BNP - Troponin flat.  Likely demand ischemia in the setting of sepsis - Downtrended   History of lung cancer status post right pneumonectomy: Follow up with Oncology outpatient   Hypokalemia:  Hypophosphatemia Repleted  Hypertension: On Coreg , will hold off hydrochlorothiazide  Hyperlipidemia: Continue statin   Tobacco abuse: - Reports he smokes 1 to 2 cigarettes/day.  Recommend cessation  --PT/OT consulted, patient declined stating he feels SOB and not ready to walk  DVT prophylaxis: Lovenox  SQ   Code Status: Full Code Disposition:  Home  Consultants:  None  Procedures:  None  Antimicrobials:  Anti-infectives (From admission, onward)    Start     Dose/Rate Route Frequency Ordered Stop   11/07/24 1515  vancomycin  (VANCOCIN ) IVPB 1000 mg/200 mL premix        1,000 mg 200 mL/hr over 60 Minutes Intravenous  Once  11/07/24 1511 11/07/24 1702   11/07/24 1515  ceFEPIme  (MAXIPIME ) 2 g in sodium chloride  0.9 % 100 mL IVPB        2 g 200 mL/hr over 30 Minutes Intravenous  Once 11/07/24 1511 11/07/24 1550   11/07/24 1515  doxycycline  (VIBRA -TABS) tablet 100 mg        100 mg Oral  Once 11/07/24 1511 11/07/24 1555       Data Reviewed: I have personally reviewed following labs and imaging studies CBC: Recent Labs   Lab 11/07/24 1450 11/07/24 1654 11/08/24 0430 11/09/24 0324  WBC 15.4* 12.6* 3.9* 4.9  NEUTROABS 12.1*  --   --   --   HGB 12.4* 11.7* 10.4* 10.4*  HCT 38.4* 36.4* 32.8* 32.1*  MCV 90.6 90.1 91.1 90.7  PLT 281 235 226 234   Basic Metabolic Panel: Recent Labs  Lab 11/07/24 1450 11/07/24 1654 11/08/24 0430 11/09/24 0324  NA 140  --  138 137  K 3.2*  --  4.4 4.2  CL 95*  --  99 99  CO2 30  --  30 30  GLUCOSE 118*  --  173* 177*  BUN 13  --  15 20  CREATININE 0.86  --  0.75 0.85  CALCIUM  9.0  --  8.5* 8.5*  MG  --  1.7  --  1.8  PHOS  --  1.8* 3.8 2.9   GFR: Estimated Creatinine Clearance: 94.2 mL/min (by C-G formula based on SCr of 0.85 mg/dL). Liver Function Tests: Recent Labs  Lab 11/07/24 1450 11/08/24 0430  AST 18 16  ALT 7 5  ALKPHOS 105 77  BILITOT 0.7 0.4  PROT 7.3 6.3*  ALBUMIN 3.2* 2.7*   CBG: Recent Labs  Lab 11/07/24 2158  GLUCAP 162*    Recent Results (from the past 240 hours)  Culture, blood (Routine x 2)     Status: None (Preliminary result)   Collection Time: 11/07/24  2:50 PM   Specimen: BLOOD  Result Value Ref Range Status   Specimen Description BLOOD BLOOD RIGHT WRIST  Final   Special Requests   Final    BOTTLES DRAWN AEROBIC AND ANAEROBIC Blood Culture results may not be optimal due to an inadequate volume of blood received in culture bottles   Culture   Final    NO GROWTH 2 DAYS Performed at [facility], [address], [place], [place] [address]    Report Status PENDING  Incomplete  Culture, blood (Routine x 2)     Status: None (Preliminary result)   Collection Time: 11/07/24  2:50 PM   Specimen: BLOOD  Result Value Ref Range Status   Specimen Description BLOOD BLOOD RIGHT FOREARM  Final   Special Requests   Final    BOTTLES DRAWN AEROBIC AND ANAEROBIC Blood Culture adequate volume   Culture   Final    NO GROWTH 2 DAYS Performed at [facility], [address] [place], [place] [address]    Report  Status PENDING  Incomplete  Resp panel by RT-PCR (RSV, Flu A&B, Covid) Anterior Nasal Swab     Status: Abnormal   Collection Time: 11/07/24  2:53 PM   Specimen: Anterior Nasal Swab  Result Value Ref Range Status   SARS Coronavirus 2 by RT PCR NEGATIVE NEGATIVE Final    Comment: (NOTE) SARS-CoV-2 target nucleic acids are NOT DETECTED.  The SARS-CoV-2 RNA is generally detectable in upper respiratory specimens during the acute phase of infection. The lowest concentration of SARS-CoV-2  viral copies this assay can detect is 138 copies/mL. A negative result does not preclude SARS-Cov-2 infection and should not be used as the sole basis for treatment or other patient management decisions. A negative result may occur with  improper specimen collection/handling, submission of specimen other than nasopharyngeal swab, presence of viral mutation(s) within the areas targeted by this assay, and inadequate number of viral copies(<138 copies/mL). A negative result must be combined with clinical observations, patient history, and epidemiological information. The expected result is Negative.  Fact Sheet for Patients:  bloggercourse.com  Fact Sheet for Healthcare Providers:  seriousbroker.it  This test is no t yet approved or cleared by the [place]  FDA and  has been authorized for detection and/or diagnosis of SARS-CoV-2 by FDA under an Emergency Use Authorization (EUA). This EUA will remain  in effect (meaning this test can be used) for the duration of the COVID-19 declaration under Section 564(b)(1) of the Act, 21 U.S.C.section 360bbb-3(b)(1), unless the authorization is terminated  or revoked sooner.       Influenza A by PCR POSITIVE (A) NEGATIVE Final   Influenza B by PCR NEGATIVE NEGATIVE Final    Comment: (NOTE) The Xpert Xpress SARS-CoV-2/FLU/RSV plus assay is intended as an aid in the diagnosis of influenza from Nasopharyngeal swab  specimens and should not be used as a sole basis for treatment. Nasal washings and aspirates are unacceptable for Xpert Xpress SARS-CoV-2/FLU/RSV testing.  Fact Sheet for Patients: bloggercourse.com  Fact Sheet for Healthcare Providers: seriousbroker.it  This test is not yet approved or cleared by the [place]  FDA and has been authorized for detection and/or diagnosis of SARS-CoV-2 by FDA under an Emergency Use Authorization (EUA). This EUA will remain in effect (meaning this test can be used) for the duration of the COVID-19 declaration under Section 564(b)(1) of the Act, 21 U.S.C. section 360bbb-3(b)(1), unless the authorization is terminated or revoked.     Resp Syncytial Virus by PCR NEGATIVE NEGATIVE Final    Comment: (NOTE) Fact Sheet for Patients: bloggercourse.com  Fact Sheet for Healthcare Providers: seriousbroker.it  This test is not yet approved or cleared by the [place]  FDA and has been authorized for detection and/or diagnosis of SARS-CoV-2 by FDA under an Emergency Use Authorization (EUA). This EUA will remain in effect (meaning this test can be used) for the duration of the COVID-19 declaration under Section 564(b)(1) of the Act, 21 U.S.C. section 360bbb-3(b)(1), unless the authorization is terminated or revoked.  Performed at [facility], [address], [place], [place] [address]      Radiology Studies: No results found.   Scheduled Meds:  carvedilol   12.5 mg Oral BID WC   enoxaparin  (LOVENOX ) injection  40 mg Subcutaneous Q24H   ipratropium-albuterol   3 mL Nebulization Q6H   methylPREDNISolone  (SOLU-MEDROL ) injection  40 mg Intravenous Q12H   pantoprazole   40 mg Oral Daily   rosuvastatin   5 mg Oral Daily   Continuous Infusions:     LOS: 2 days  MDM: Patient is high risk for one or more organ failure.  They necessitate  ongoing hospitalization for continued IV therapies and subsequent lab monitoring. Total time spent interpreting labs and vitals, reviewing the medical record, coordinating care amongst consultants and care team members, directly assessing and discussing care with the patient and/or family: 35 min Laree Lock, MD [facility]  To contact the attending physician between 7A-7P please use Epic Chat. To contact the covering physician during after hours 7P-7A, please review Amion.  11/09/2024, 5:14 PM   *This document has been created with the assistance of dictation software. Please excuse typographical errors. *   "

## 2024-11-09 NOTE — Plan of Care (Signed)
  Problem: Fluid Volume: Goal: Hemodynamic stability will improve Outcome: Progressing   Problem: Clinical Measurements: Goal: Diagnostic test results will improve Outcome: Progressing   Problem: Clinical Measurements: Goal: Signs and symptoms of infection will decrease Outcome: Progressing   Problem: Respiratory: Goal: Ability to maintain adequate ventilation will improve Outcome: Progressing

## 2024-11-09 NOTE — Progress Notes (Signed)
 OT Cancellation Note  Patient Details Name: Geoffrey Walker MRN: [id number] DOB: January 16, 1961   Cancelled Treatment:    Reason Eval/Treat Not Completed: Patient declined, no reason specified;Other (comment) (patient reporting I'm having a hard time breathing today and I just don't want to do it. OT explained benefits of OOB activity but he continued to refuse. OT to follow up tomorrow, patient agreeable.)  [name] Clause 11/09/2024, 2:47 PM

## 2024-11-10 DIAGNOSIS — J9621 Acute and chronic respiratory failure with hypoxia: Secondary | ICD-10-CM | POA: Diagnosis not present

## 2024-11-10 LAB — BASIC METABOLIC PANEL WITH GFR
Anion gap: 5 (ref 5–15)
BUN: 22 mg/dL (ref 8–23)
CO2: 33 mmol/L — ABNORMAL HIGH (ref 22–32)
Calcium: 8.4 mg/dL — ABNORMAL LOW (ref 8.9–10.3)
Chloride: 97 mmol/L — ABNORMAL LOW (ref 98–111)
Creatinine, Ser: 0.94 mg/dL (ref 0.61–1.24)
GFR, Estimated: >60 mL/min
Glucose, Bld: 155 mg/dL — ABNORMAL HIGH (ref 70–99)
Potassium: 4.6 mmol/L (ref 3.5–5.1)
Sodium: 134 mmol/L — ABNORMAL LOW (ref 135–145)

## 2024-11-10 LAB — MAGNESIUM: Magnesium: 1.9 mg/dL (ref 1.7–2.4)

## 2024-11-10 LAB — PHOSPHORUS: Phosphorus: 3.7 mg/dL (ref 2.5–4.6)

## 2024-11-10 MED ORDER — HYDROXYZINE HCL 25 MG PO TABS
25.0000 mg | ORAL_TABLET | Freq: Three times a day (TID) | ORAL | Status: DC | PRN
  Administered 2024-11-10 – 2024-11-11 (×2): 25 mg via ORAL
  Filled 2024-11-10 (×2): qty 1

## 2024-11-10 MED ORDER — PREDNISONE 20 MG PO TABS: 40.0000 mg | ORAL_TABLET | Freq: Every day | ORAL | Status: DC

## 2024-11-10 NOTE — TOC CM/SW Note (Signed)
 Transition of Care [facility]) - Inpatient Brief Assessment   Patient Details  Name: Geoffrey Walker MRN: [id number] Date of Birth: 07-10-1961  Transition of Care [facility]) CM/SW Contact:    Lauraine JAYSON Carpen, LCSW Phone Number: 11/10/2024, 4:31 PM   Clinical Narrative: CSW reviewed chart. PT/OT evaluations are pending. CSW will follow for recommendations. No other TOC needs identified so far.  Transition of Care Asessment: Insurance and Status: Insurance coverage has been reviewed Patient has primary care physician: Yes Home environment has been reviewed: Apartment Prior level of function:: Not documented Prior/Current Home Services: No current home services Social Drivers of Health Review: SDOH reviewed no interventions necessary Readmission risk has been reviewed: Yes Transition of care needs: no transition of care needs at this time

## 2024-11-10 NOTE — Progress Notes (Signed)
 OT Cancellation Note  Patient Details Name: Geoffrey Walker MRN: [id number] DOB: Feb 12, 1961   Cancelled Treatment:    Reason Eval/Treat Not Completed: Patient declined, no reason specified;Other (comment) (continues to report not breathing well, again OT explained role of OT/PT while in acute care, RN attempted as well, patient continues to refuse evaluation)  [name] Clause 11/10/2024, 9:11 AM

## 2024-11-10 NOTE — Progress Notes (Signed)
 Heart Failure Navigator Progress Note  Assessed for Heart & Vascular TOC clinic readiness.  Patient does not meet criteria due to hospitalist note on 11/09/24. Patient with a medical history of lung cancer post pneumonectomy. Current admission for  respiratory failure in the setting of influenza and severe sepsis.  Navigator available for reassessment of patient but will sign off at this time.  Charmaine Pines, RN, BSN [facility] Heart Failure Navigator Secure Chat Only

## 2024-11-10 NOTE — Progress Notes (Signed)
 " PROGRESS NOTE    Geoffrey Walker  FMW:[id number] DOB: 1961-03-02 DOA: 11/07/2024 PCP: [facility]  Chief Complaint  Patient presents with   Shortness of Breath    Hospital Course:  Geoffrey Walker is a 63 y.o. male with medical history significant of lung cancer status post pneumonectomy, COPD, chronic hypoxemic respiratory failure in the setting of COPD on 2 L of oxygen via nasal cannula, hypertension, chronic venous stasis, tobacco abuse presented with worsening shortness of breath since 1 week.  Reports being sick with SOB and wheezing x 1 week.  Workup revealed influenza positive, admitted for acute on chronic hypoxic respiratory failure.  Hospital course as below  Subjective: Patient was examined at the bedside, reports doing better today  Patient is very anxious due to history of pneumonectomy, is saturating 100% on supplemental oxygen, but complaining of difficulty breathing once we try to wean. Will try hydroxyzine  as needed to help with anxiety, wean to 3 L [place] Encouraged to work with PT/OT, discussed that he will get deconditioned if he does not get out of bed, patient agreeable  Objective: Vitals:   11/10/24 0826 11/10/24 1209 11/10/24 1254 11/10/24 1609  BP: 122/78 136/77  130/78  Pulse: 70 76  76  Resp:  18  18  Temp: 97.8 F (36.6 C) 97.7 F (36.5 C)  (!) 97.4 F (36.3 C)  TempSrc: Oral Oral  Oral  SpO2: 100% 99% 95% 100%  Weight:      Height:        Intake/Output Summary (Last 24 hours) at 11/10/2024 1907 Last data filed at 11/10/2024 0800 Gross per 24 hour  Intake 240 ml  Output 400 ml  Net -160 ml   Filed Weights   11/07/24 1507  Weight: 85.8 kg    Examination: Constitutional: NAD, calm, comfortable, on nasal cannula, able to complete sentences Neck: normal, supple, no masses, no thyromegaly Respiratory: Minimal wheezing bilateral bases Cardiovascular: Regular rate and rhythm, no murmurs / rubs / gallops. No extremity edema. 2+ pedal pulses.  No carotid bruits.  Abdomen: no tenderness, no masses palpated. No hepatosplenomegaly. Bowel sounds positive.  Musculoskeletal: no clubbing / cyanosis. No joint deformity upper and lower extremities. Good ROM, no contractures. Normal muscle tone.  Skin: no rashes, lesions, ulcers. No induration Neurologic: CN 2-12 grossly intact. Sensation intact, DTR normal. Strength 5/5 in all 4.  Psychiatric: Normal judgment and insight. Alert and oriented x 3. Normal mood  Assessment & Plan:  Acute on chronic hypoxemic respiratory failure in the setting of influenza A Severe sepsis in the setting of influenza A Baseline 2 L of oxygen via nasal cannula, requiring 3 LNC now Chest x-ray negative for pneumonia, no indication for antibiotics Out of window for Tamiflu Change Solu-Medrol  to p.o. prednisone , Duonebs q6 Hydroxyzine  for anxiety Follow up blood cultures Wean oxygen as able, very anxious due to hx pneumonectomy  Elevated troponin Elevated BNP - Troponin flat.  Likely demand ischemia in the setting of sepsis - Downtrended   History of lung cancer status post right pneumonectomy: Follow up with Oncology outpatient   Hypokalemia:  Hypophosphatemia Repleted  Hypertension: On Coreg , will hold off hydrochlorothiazide  Hyperlipidemia: Continue statin   Tobacco abuse: - Reports he smokes 1 to 2 cigarettes/day.  Recommend cessation  --PT/OT consulted, patient declined stating he feels SOB and not ready to walk Added hydroxyzine  for anxiety and encouraged patient to get out of bed to avoid deconditioning  DVT prophylaxis: Lovenox  SQ   Code Status: Full  Code Disposition:  Home  Consultants:  None  Procedures:  None  Antimicrobials:  Anti-infectives (From admission, onward)    Start     Dose/Rate Route Frequency Ordered Stop   11/07/24 1515  vancomycin  (VANCOCIN ) IVPB 1000 mg/200 mL premix        1,000 mg 200 mL/hr over 60 Minutes Intravenous  Once 11/07/24 1511 11/07/24 1702    11/07/24 1515  ceFEPIme  (MAXIPIME ) 2 g in sodium chloride  0.9 % 100 mL IVPB        2 g 200 mL/hr over 30 Minutes Intravenous  Once 11/07/24 1511 11/07/24 1550   11/07/24 1515  doxycycline  (VIBRA -TABS) tablet 100 mg        100 mg Oral  Once 11/07/24 1511 11/07/24 1555       Data Reviewed: I have personally reviewed following labs and imaging studies CBC: Recent Labs  Lab 11/07/24 1450 11/07/24 1654 11/08/24 0430 11/09/24 0324  WBC 15.4* 12.6* 3.9* 4.9  NEUTROABS 12.1*  --   --   --   HGB 12.4* 11.7* 10.4* 10.4*  HCT 38.4* 36.4* 32.8* 32.1*  MCV 90.6 90.1 91.1 90.7  PLT 281 235 226 234   Basic Metabolic Panel: Recent Labs  Lab 11/07/24 1450 11/07/24 1654 11/08/24 0430 11/09/24 0324 11/10/24 0425  NA 140  --  138 137 134*  K 3.2*  --  4.4 4.2 4.6  CL 95*  --  99 99 97*  CO2 30  --  30 30 33*  GLUCOSE 118*  --  173* 177* 155*  BUN 13  --  15 20 22   CREATININE 0.86  --  0.75 0.85 0.94  CALCIUM  9.0  --  8.5* 8.5* 8.4*  MG  --  1.7  --  1.8 1.9  PHOS  --  1.8* 3.8 2.9 3.7   GFR: Estimated Creatinine Clearance: 85.2 mL/min (by C-G formula based on SCr of 0.94 mg/dL). Liver Function Tests: Recent Labs  Lab 11/07/24 1450 11/08/24 0430  AST 18 16  ALT 7 5  ALKPHOS 105 77  BILITOT 0.7 0.4  PROT 7.3 6.3*  ALBUMIN 3.2* 2.7*   CBG: Recent Labs  Lab 11/07/24 2158  GLUCAP 162*    Recent Results (from the past 240 hours)  Culture, blood (Routine x 2)     Status: None (Preliminary result)   Collection Time: 11/07/24  2:50 PM   Specimen: BLOOD  Result Value Ref Range Status   Specimen Description BLOOD BLOOD RIGHT WRIST  Final   Special Requests   Final    BOTTLES DRAWN AEROBIC AND ANAEROBIC Blood Culture results may not be optimal due to an inadequate volume of blood received in culture bottles   Culture   Final    NO GROWTH 3 DAYS Performed at [facility], [address], [place], [place] [address]    Report Status PENDING  Incomplete  Culture,  blood (Routine x 2)     Status: None (Preliminary result)   Collection Time: 11/07/24  2:50 PM   Specimen: BLOOD  Result Value Ref Range Status   Specimen Description BLOOD BLOOD RIGHT FOREARM  Final   Special Requests   Final    BOTTLES DRAWN AEROBIC AND ANAEROBIC Blood Culture adequate volume   Culture   Final    NO GROWTH 3 DAYS Performed at [facility], [address] [place], [place] [address]    Report Status PENDING  Incomplete  Resp panel by RT-PCR (RSV, Flu A&B, Covid) Anterior  Nasal Swab     Status: Abnormal   Collection Time: 11/07/24  2:53 PM   Specimen: Anterior Nasal Swab  Result Value Ref Range Status   SARS Coronavirus 2 by RT PCR NEGATIVE NEGATIVE Final    Comment: (NOTE) SARS-CoV-2 target nucleic acids are NOT DETECTED.  The SARS-CoV-2 RNA is generally detectable in upper respiratory specimens during the acute phase of infection. The lowest concentration of SARS-CoV-2 viral copies this assay can detect is 138 copies/mL. A negative result does not preclude SARS-Cov-2 infection and should not be used as the sole basis for treatment or other patient management decisions. A negative result may occur with  improper specimen collection/handling, submission of specimen other than nasopharyngeal swab, presence of viral mutation(s) within the areas targeted by this assay, and inadequate number of viral copies(<138 copies/mL). A negative result must be combined with clinical observations, patient history, and epidemiological information. The expected result is Negative.  Fact Sheet for Patients:  bloggercourse.com  Fact Sheet for Healthcare Providers:  seriousbroker.it  This test is no t yet approved or cleared by the [place]  FDA and  has been authorized for detection and/or diagnosis of SARS-CoV-2 by FDA under an Emergency Use Authorization (EUA). This EUA will remain  in effect (meaning this test  can be used) for the duration of the COVID-19 declaration under Section 564(b)(1) of the Act, 21 U.S.C.section 360bbb-3(b)(1), unless the authorization is terminated  or revoked sooner.       Influenza A by PCR POSITIVE (A) NEGATIVE Final   Influenza B by PCR NEGATIVE NEGATIVE Final    Comment: (NOTE) The Xpert Xpress SARS-CoV-2/FLU/RSV plus assay is intended as an aid in the diagnosis of influenza from Nasopharyngeal swab specimens and should not be used as a sole basis for treatment. Nasal washings and aspirates are unacceptable for Xpert Xpress SARS-CoV-2/FLU/RSV testing.  Fact Sheet for Patients: bloggercourse.com  Fact Sheet for Healthcare Providers: seriousbroker.it  This test is not yet approved or cleared by the [place]  FDA and has been authorized for detection and/or diagnosis of SARS-CoV-2 by FDA under an Emergency Use Authorization (EUA). This EUA will remain in effect (meaning this test can be used) for the duration of the COVID-19 declaration under Section 564(b)(1) of the Act, 21 U.S.C. section 360bbb-3(b)(1), unless the authorization is terminated or revoked.     Resp Syncytial Virus by PCR NEGATIVE NEGATIVE Final    Comment: (NOTE) Fact Sheet for Patients: bloggercourse.com  Fact Sheet for Healthcare Providers: seriousbroker.it  This test is not yet approved or cleared by the [place]  FDA and has been authorized for detection and/or diagnosis of SARS-CoV-2 by FDA under an Emergency Use Authorization (EUA). This EUA will remain in effect (meaning this test can be used) for the duration of the COVID-19 declaration under Section 564(b)(1) of the Act, 21 U.S.C. section 360bbb-3(b)(1), unless the authorization is terminated or revoked.  Performed at [facility], [address] [place], [place] [address]      Radiology Studies: No  results found.   Scheduled Meds:  carvedilol   12.5 mg Oral BID WC   enoxaparin  (LOVENOX ) injection  40 mg Subcutaneous Q24H   ipratropium-albuterol   3 mL Nebulization TID   pantoprazole   40 mg Oral Daily   [START ON 11/11/2024] predniSONE   40 mg Oral Q breakfast   rosuvastatin   5 mg Oral Daily   Continuous Infusions:     LOS: 3 days  MDM: Patient is high risk for one or more organ  failure.  They necessitate ongoing hospitalization for continued IV therapies and subsequent lab monitoring. Total time spent interpreting labs and vitals, reviewing the medical record, coordinating care amongst consultants and care team members, directly assessing and discussing care with the patient and/or family: 35 min Laree Lock, MD [facility]  To contact the attending physician between 7A-7P please use Epic Chat. To contact the covering physician during after hours 7P-7A, please review Amion.  11/10/2024, 7:07 PM   *This document has been created with the assistance of dictation software. Please excuse typographical errors. *   "

## 2024-11-11 DIAGNOSIS — J9621 Acute and chronic respiratory failure with hypoxia: Secondary | ICD-10-CM | POA: Diagnosis not present

## 2024-11-11 LAB — BLOOD GAS, VENOUS
Acid-Base Excess: 12.5 mmol/L — ABNORMAL HIGH (ref 0.0–2.0)
Bicarbonate: 41 mmol/L — ABNORMAL HIGH (ref 20.0–28.0)
O2 Saturation: 99.8 %
Patient temperature: 37
pCO2, Ven: 71 mmHg (ref 44–60)
pH, Ven: 7.37 (ref 7.25–7.43)
pO2, Ven: 114 mmHg — ABNORMAL HIGH (ref 32–45)

## 2024-11-11 LAB — CBC
HCT: 32.2 % — ABNORMAL LOW (ref 39.0–52.0)
Hemoglobin: 10.5 g/dL — ABNORMAL LOW (ref 13.0–17.0)
MCH: 28.9 pg (ref 26.0–34.0)
MCHC: 32.6 g/dL (ref 30.0–36.0)
MCV: 88.7 fL (ref 80.0–100.0)
Platelets: 218 K/uL (ref 150–400)
RBC: 3.63 MIL/uL — ABNORMAL LOW (ref 4.22–5.81)
RDW: 12.7 % (ref 11.5–15.5)
WBC: 4.5 K/uL (ref 4.0–10.5)
nRBC: 0 % (ref 0.0–0.2)

## 2024-11-11 LAB — BASIC METABOLIC PANEL WITH GFR
Anion gap: 4 — ABNORMAL LOW (ref 5–15)
BUN: 24 mg/dL — ABNORMAL HIGH (ref 8–23)
CO2: 35 mmol/L — ABNORMAL HIGH (ref 22–32)
Calcium: 8.7 mg/dL — ABNORMAL LOW (ref 8.9–10.3)
Chloride: 100 mmol/L (ref 98–111)
Creatinine, Ser: 0.84 mg/dL (ref 0.61–1.24)
GFR, Estimated: >60 mL/min
Glucose, Bld: 123 mg/dL — ABNORMAL HIGH (ref 70–99)
Potassium: 4.8 mmol/L (ref 3.5–5.1)
Sodium: 138 mmol/L (ref 135–145)

## 2024-11-11 MED ORDER — HYDROXYZINE HCL 50 MG PO TABS
50.0000 mg | ORAL_TABLET | Freq: Three times a day (TID) | ORAL | Status: DC | PRN
  Administered 2024-11-11 – 2024-11-25 (×13): 50 mg via ORAL
  Filled 2024-11-11 (×2): qty 2
  Filled 2024-11-11: qty 1
  Filled 2024-11-11 (×3): qty 2
  Filled 2024-11-11: qty 1
  Filled 2024-11-11: qty 2
  Filled 2024-11-11 (×3): qty 1
  Filled 2024-11-11 (×4): qty 2

## 2024-11-11 MED ORDER — GUAIFENESIN ER 600 MG PO TB12
600.0000 mg | ORAL_TABLET | Freq: Two times a day (BID) | ORAL | Status: DC
  Administered 2024-11-11 – 2024-11-12 (×2): 600 mg via ORAL
  Filled 2024-11-11 (×3): qty 1

## 2024-11-11 NOTE — Progress Notes (Signed)
 Patient displaying signs of anxiety. O2 saturation 100%. States he feels like he can't catch his breath. MD aware and new anxiety medication ordered and given. Respiratory therapist at bedside to give nebulizer.

## 2024-11-11 NOTE — Evaluation (Signed)
 Occupational Therapy Evaluation Patient Details Name: Geoffrey Walker MRN: [id number] DOB: [birth year] Today's Date: 11/11/2024   History of Present Illness   Geoffrey Walker is a 63 y.o. male with medical history significant of lung cancer status post pneumonectomy, COPD, chronic hypoxemic respiratory failure in the setting of COPD on 2 L of oxygen via nasal cannula, hypertension, chronic venous stasis, tobacco abuse presented with worsening shortness of breath since 1 week. Code sepsis activated.  Chest x-ray shows complete opacification of right hemithorax consistent with provided history of total pneumonectomy.     Clinical Impressions Patient was seen for OT evaluation this date. He has refused OT eval the last 2 days due to concerns of not being able to breathe education has been provided on importance of therapy with RN providing anxiety medication prior to OT attempt on 3rd day. Patient presents with increased lethargy, was not oriented to situation/date and did not present this way previously. Prior to hospital admission, patient was living in a home with several friends, he reports he is able to manage his own self care but his roomates/friends manage majority of IADLs. He ambulates with a rollator. Patient on 6L of O2, satting 98%, OT decreased to 4L, he remained above 95% at rest. He required significant coaxing/encouragement to transition to EOB and transfer to recliner (performed with min A). He stated he could not breathe once seated in recliner (O2 93% and HR 90); cued patient on pursed lip breathing techniques and relaxation methods with improvement in breathing noted. He reports 9/10 pain in back, RN notified. Patient presents with deficits in activity tolerance and gross strength, affecting safe and optimal ADL completion. Patient is currently requiring mod A for LB self care tasks due to back pain/deconditioning.  Paient would benefit from skilled OT services to address noted impairments  and functional limitations (see below for any additional details) in order to maximize safety and independence while minimizing future risk of falls, injury, and readmission.  Anticipate the need for follow up OT services upon acute hospital DC.      If plan is discharge home, recommend the following:   A little help with walking and/or transfers;A little help with bathing/dressing/bathroom;Supervision due to cognitive status;Assistance with cooking/housework     Functional Status Assessment   Patient has had a recent decline in their functional status and demonstrates the ability to make significant improvements in function in a reasonable and predictable amount of time.     Equipment Recommendations   None recommended by OT     Recommendations for Other Services         Precautions/Restrictions   Precautions Precautions: Fall Recall of Precautions/Restrictions: Intact Restrictions Weight Bearing Restrictions Per Provider Order: No     Mobility Bed Mobility Overal bed mobility: Needs Assistance Bed Mobility: Supine to Sit     Supine to sit: Min assist     General bed mobility comments: requested hand held A    Transfers Overall transfer level: Needs assistance Equipment used: Rolling walker (2 wheels) Transfers: Bed to chair/wheelchair/BSC       Step pivot transfers: Min assist     General transfer comment: cues for hand placement and for anterior weight shift once in stance      Balance Overall balance assessment: Needs assistance Sitting-balance support: No upper extremity supported, Feet supported Sitting balance-[name]: Good     Standing balance support: During functional activity, Reliant on assistive device for balance Standing balance-[name]: Fair  ADL either performed or assessed with clinical judgement   ADL Overall ADL's : Needs assistance/impaired     Grooming: Wash/dry  hands;Wash/dry face;Standing;Contact guard assist   Upper Body Bathing: Set up;Sitting   Lower Body Bathing: Moderate assistance;Sit to/from stand   Upper Body Dressing : Set up;Sitting   Lower Body Dressing: Moderate assistance;Sit to/from stand   Toilet Transfer: Minimal assistance   Toileting- Clothing Manipulation and Hygiene: Minimal assistance         General ADL Comments: A for LB self care tasks due to low back pain     Vision         Perception         Praxis         Pertinent Vitals/Pain Pain Assessment Pain Assessment: 0-10 Pain Score: 9  Pain Location: back Pain Descriptors / Indicators: Aching Pain Intervention(s): Monitored during session, Patient requesting pain meds-RN notified     Extremity/Trunk Assessment Upper Extremity Assessment Upper Extremity Assessment: Generalized weakness   Lower Extremity Assessment Lower Extremity Assessment: Defer to PT evaluation       Communication Communication Communication: Impaired Factors Affecting Communication: Hearing impaired   Cognition Arousal: Lethargic Behavior During Therapy: Flat affect Cognition: Cognition impaired   Orientation impairments: Time, Situation Awareness: Intellectual awareness impaired                         Following commands: Impaired Following commands impaired: Follows one step commands with increased time     Cueing  General Comments   Cueing Techniques: Verbal cues  started tx on 6L, decreased to 4L, remained above 92% throughout tx   Exercises     Shoulder Instructions      Home Living Family/patient expects to be discharged to:: Private residence Living Arrangements: Non-relatives/Friends Available Help at Discharge: Friend(s);Available 24 hours/day Type of Home: House Home Access: Stairs to enter [employer] of Steps: 4 Entrance Stairs-Rails: Can reach both Home Layout: Able to live on main level with bedroom/bathroom      Bathroom Shower/Tub: Chief Strategy Officer: Standard Bathroom Accessibility: Yes How Accessible: Accessible via walker Home Equipment: Tub bench;Rollator (4 wheels)   Additional Comments: reports he lives in a house with several friends      Prior Functioning/Environment Prior Level of Function : Needs assist             Mobility Comments: uses rollator at all times ADLs Comments: can manage basic ADLs without A, friends help with IADLS    OT Problem List: Decreased strength;Decreased activity tolerance;Impaired balance (sitting and/or standing);Decreased safety awareness   OT Treatment/Interventions: Self-care/ADL training;Energy conservation;Patient/family education      OT Goals(Current goals can be found in the care plan section)   Acute Rehab OT Goals Patient Stated Goal: to go home OT Goal Formulation: With patient Time For Goal Achievement: 11/25/24 Potential to Achieve Goals: Fair ADL Goals Pt Will Perform Grooming: with modified independence;standing Pt Will Perform Lower Body Dressing: with modified independence;sit to/from stand Pt Will Transfer to Toilet: with modified independence;ambulating;regular height toilet Pt Will Perform Toileting - Clothing Manipulation and hygiene: with modified independence;sit to/from stand   OT Frequency:  Min 2X/week    Co-evaluation              AM-PAC OT 6 Clicks Daily Activity     Outcome Measure Help from another person eating meals?: None Help from another person taking care of personal grooming?: None Help from  another person toileting, which includes using toliet, bedpan, or urinal?: A Little Help from another person bathing (including washing, rinsing, drying)?: A Little Help from another person to put on and taking off regular upper body clothing?: None Help from another person to put on and taking off regular lower body clothing?: A Little 6 Click Score: 21   End of Session Equipment  Utilized During Treatment: Gait belt;Rolling walker (2 wheels);Oxygen Nurse Communication: Patient requests pain meds  Activity Tolerance: Patient tolerated treatment well Patient left: in chair;with call bell/phone within reach;with chair alarm set  OT Visit Diagnosis: Unsteadiness on feet (R26.81);Muscle weakness (generalized) (M62.81);Other abnormalities of gait and mobility (R26.89)                Time: 9142-9069 OT Time Calculation (min): 33 min Charges:  OT General Charges $OT Visit: 1 Visit OT Evaluation $OT Eval Low Complexity: 1 Low OT Treatments $Self Care/Home Management : 8-22 mins  Rogers Clause, OT/L MSOT, 11/11/2024

## 2024-11-11 NOTE — Progress Notes (Signed)
 PT Cancellation Note  Patient Details Name: Geoffrey Walker MRN: [id number] DOB: Jun 24, 1961   Cancelled Treatment:    Reason Eval/Treat Not Completed:  (Evaluation re-attempted.  Patient already returned to bed after OT eval this AM; declined further mobility/OOB efforts.  Generally lethargic (?anxiety meds?), but oriented and appropriate in interactions. Will re-attempt next date as appropriate.)  Asaiah Hunnicutt H. Delores, PT, DPT, NCS 11/11/2024, 1:31 PM [phone]

## 2024-11-11 NOTE — Progress Notes (Signed)
 " PROGRESS NOTE Geoffrey Walker  FMW:[id number] DOB: 09/03/1961 DOA: 11/07/2024 PCP: [facility]  Chief Complaint  Patient presents with   Shortness of Breath   Hospital Course:  Geoffrey Walker is a 63 y.o. male with medical history significant of lung cancer status post pneumonectomy, COPD, chronic hypoxemic respiratory failure in the setting of COPD on 2 L of oxygen via nasal cannula, hypertension, chronic venous stasis, tobacco dependence presented with worsening shortness of breath since 1 week.  Workup revealed influenza positive.  Subjective: Patient reports feeling better. Still does not feel safe going home. He states that he has an oxygen concentrator at home which he uses up to 5L.   Objective: Blood pressure (!) 140/82, pulse 69, temperature 97.6 F (36.4 C), temperature source Oral, resp. rate 18, height 5' 10 (1.778 m), weight 85.8 kg, SpO2 100%.  Examination: Constitutional: NAD, on nasal cannula, able to complete sentences Respiratory: no wheezing. CTA L Cardiovascular: Regular rate and rhythm, no murmurs / rubs / gallops. No extremity edema.  Musculoskeletal: no clubbing / cyanosis. No joint deformity upper and lower extremities. Good ROM, no contractures. Normal muscle tone.  Skin: no rashes, lesions, ulcers. No induration Psychiatric: anxious  Assessment & Plan:  Acute on chronic hypoxemic respiratory failure in the setting of influenza A Baseline 2-5 L of oxygen via nasal cannula, requiring 4L [place] since he was working with PT. He states that he uses 5L with his concentrator at home. Does not feel safe going home today. Breathing is about at baseline.  Chest x-ray negative for pneumonia, no indication for antibiotics Out of window for Tamiflu continue Duonebs q6 Stopped steroids Hydroxyzine  for anxiety PRN Wean oxygen as able, very anxious due to hx pneumonectomy Worked with OT today who recommended SNF  Elevated troponin Elevated BNP - Troponin flat.   Likely demand ischemia in the setting of sepsis. Downtrended   History of lung cancer status post right pneumonectomy: Follow up with Oncology outpatient   Hypokalemia:  Hypophosphatemia Repleted  Hypertension: On Coreg , will hold off hydrochlorothiazide  Hyperlipidemia: Continue statin   Tobacco abuse: - Reports he smokes 1 to 2 cigarettes/day.  Recommend cessation  DVT prophylaxis: Lovenox  SQ   Code Status: Full Code Disposition:  Home  Consultants:  None  Procedures:  None  Antimicrobials:  Anti-infectives (From admission, onward)    Start     Dose/Rate Route Frequency Ordered Stop   11/07/24 1515  vancomycin  (VANCOCIN ) IVPB 1000 mg/200 mL premix        1,000 mg 200 mL/hr over 60 Minutes Intravenous  Once 11/07/24 1511 11/07/24 1702   11/07/24 1515  ceFEPIme  (MAXIPIME ) 2 g in sodium chloride  0.9 % 100 mL IVPB        2 g 200 mL/hr over 30 Minutes Intravenous  Once 11/07/24 1511 11/07/24 1550   11/07/24 1515  doxycycline  (VIBRA -TABS) tablet 100 mg        100 mg Oral  Once 11/07/24 1511 11/07/24 1555      Data Reviewed: I have personally reviewed following labs and imaging studies CBC: Recent Labs  Lab 11/07/24 1450 11/07/24 1654 11/08/24 0430 11/09/24 0324 11/11/24 0436  WBC 15.4* 12.6* 3.9* 4.9 4.5  NEUTROABS 12.1*  --   --   --   --   HGB 12.4* 11.7* 10.4* 10.4* 10.5*  HCT 38.4* 36.4* 32.8* 32.1* 32.2*  MCV 90.6 90.1 91.1 90.7 88.7  PLT 281 235 226 234 218   Basic Metabolic Panel: Recent Labs  Lab  11/07/24 1450 11/07/24 1654 11/08/24 0430 11/09/24 0324 11/10/24 0425 11/11/24 0436  NA 140  --  138 137 134* 138  K 3.2*  --  4.4 4.2 4.6 4.8  CL 95*  --  99 99 97* 100  CO2 30  --  30 30 33* 35*  GLUCOSE 118*  --  173* 177* 155* 123*  BUN 13  --  15 20 22  24*  CREATININE 0.86  --  0.75 0.85 0.94 0.84  CALCIUM  9.0  --  8.5* 8.5* 8.4* 8.7*  MG  --  1.7  --  1.8 1.9  --   PHOS  --  1.8* 3.8 2.9 3.7  --     LOS: 4 days  MDM: Patient is high  risk for one or more organ failure.  They necessitate ongoing hospitalization for continued IV therapies and subsequent lab monitoring. Total time spent interpreting labs and vitals, reviewing the medical record, coordinating care amongst consultants and care team members, directly assessing and discussing care with the patient and/or family: 35 min Marien LITTIE Piety, MD [facility]  To contact the attending physician between 7A-7P please use Epic Chat. To contact the covering physician during after hours 7P-7A, please review Amion.  11/11/2024, 8:18 AM  "

## 2024-11-12 ENCOUNTER — Inpatient Hospital Stay: Payer: MEDICAID

## 2024-11-12 DIAGNOSIS — J9621 Acute and chronic respiratory failure with hypoxia: Secondary | ICD-10-CM | POA: Diagnosis not present

## 2024-11-12 LAB — CULTURE, BLOOD (ROUTINE X 2)
Culture: NO GROWTH
Culture: NO GROWTH
Special Requests: ADEQUATE

## 2024-11-12 NOTE — Plan of Care (Signed)

## 2024-11-12 NOTE — Progress Notes (Signed)
 Critical value VBG: compensated respiratory acidosis Ph- 7.37 Co2-71 Bicarb-41 Pao2-114  Pt-currently resting in bed ,comfortable.

## 2024-11-12 NOTE — Significant Event (Signed)
 Rapid Response Event Note   Reason for Call : choking   Initial Focused Assessment:  Upon arrival patient sitting on edge of bed requesting chapstick. Patient has some difficulty breathing but able to talk in full sentences and verbalize needs. Patient was on his prescribed 5L [place], auscultation presented diminished and course crackles air movement.  Nursing staff reports patient was taking PO medications and began choking with no ability to cough. It is believed he choked on a mucinex pill. Patient confirms it just got caught up in there.  Interventions:  Heimlich performed by nursing staff, continued O2 support, emotional support  Plan of Care:  Speech therapy consult, strict NPO until evaluation  Event Summary:   MD Notified: Dr. Quenton discussed with her, no further interventions needed Call Time: 2108 Arrival Time: 2109 End Time: 2115  Warren Forbes, RN

## 2024-11-12 NOTE — Progress Notes (Addendum)
"  ° °      CROSS COVER NOTE   Significant event: Overhead rapid response  NAME: Geoffrey Walker MRN: [id number] DOB : [birth year]    Concern as stated by nurse / staff   Acute choking episode while taking meds  Posterior Heimlich maneuver performed by bedside nurse, with patient subsequently coughing up a pill, returning to baseline     Pertinent findings on chart review: --History of right pneumonectomy secondary to lung cancer on home O2 at 2-5 L  -- Admitted 11/07/24 with worsening respiratory failure secondary to influenza A -- BNP 6888 on 12/27 with no CHF history   Patient Assessment Upon my arrival, rapid response team at bedside.  Patient sitting up on side of bed.  Looks frail but in no distress.  Nursing reports that patient coughed up pill.  Showing a large pill--mucinex Vital trend: Date/Time Temp Pulse Resp BP SpO2 O2 Device O2  (L/min)  11/12/24 21:21:10 98.1 F (36.7 C) 78 20 143/92 Abnormal  99 % Nasal Cannula 5 L/min  11/12/24 19:51:58 97.6 F (36.4 C) 77 19 122/86 100 % Nasal Cannula 5 L/min  11/12/24 1926 -- -- -- -- 96 % Nasal Cannula 5 L/min  11/12/24 16:01:56 97.6 F (36.4 C) 70 16 130/90 Abnormal  100 % Nasal Cannula 5 L/min  Physical Exam Constitutional:      Comments: Frail, mild tachypnea  Cardiovascular:     Rate and Rhythm: Normal rate and regular rhythm.  Pulmonary:     Effort: Tachypnea present.     Breath sounds: Normal breath sounds. No stridor.  Neurological:     Mental Status: Mental status is at baseline.       Assessment and  Interventions   Assessment:  Choking episode during med administration, resolved with Heimlich  Plan: Strict n.p.o. Speech therapy evaluation in the a.m. X    Update Addendum 3:30 AM --Patient continues to have episodes of shortness of breath, saying he cannot breathe... Vitals remaining stable --Treated empirically with Solu-Medrol  125 mg subsequently with Lasix 40 mg without subjective improvement --Started  on BiPAP --Chest x-ray ordered unchanged from prior-known right pneumonectomy --ABG improved from prior --Trying empiric Ativan at this time --Continue close monitoring --CTA chest being ordered  -- Given elevated BNP over 6000 on 12/27, can consider further eval with echocardiogram     11/13/2024    4:22 AM 11/13/2024    3:34 AM 11/13/2024    2:20 AM  Vitals with BMI  Systolic 130 145 894  Diastolic 90 91 77  Pulse 79 76 83        CRITICAL CARE Performed by: Delayne LULLA Solian   Total critical care time: 120 minutes  Critical care time was exclusive of separately billable procedures and treating other patients.  Critical care was necessary to treat or prevent imminent or life-threatening deterioration.  Critical care was time spent personally by me on the following activities: development of treatment plan with patient and/or surrogate as well as nursing, discussions with consultants, evaluation of patient's response to treatment, examination of patient, obtaining history from patient or surrogate, ordering and performing treatments and interventions, ordering and review of laboratory studies, ordering and review of radiographic studies, pulse oximetry and re-evaluation of patient's condition.  "

## 2024-11-12 NOTE — Evaluation (Signed)
 Physical Therapy Evaluation Patient Details Name: Geoffrey Walker MRN: [id number] DOB: 1961-03-10 Today's Date: 11/12/2024  History of Present Illness  Pt is an 64 y/o M admitted on 11/11/24 after presenting with c/o dyspnea with associated mid sternal chest pain worsening with cough. Pt is being treated for aspiration PNA. PMH: BPH, CAD, CABG, PCI & stent, GERD, HTN, dyslipidemia, PVD, OA, MI, R BBB, urinary incontinence  Clinical Impression  Pt seen for PT evaluation with pt agreeable with encouragement. Pt appears confused, anxious re: his breathing with PT reassuring him throughout session. Pt requires min assist for bed mobility & transfer to recliner, SpO2 >90% on supplemental O2. Provided pt with incentive spirometer but pt reports I can't do it despite encouragement. Recommend ongoing PT services to progress mobility as able.        If plan is discharge home, recommend the following: A lot of help with walking and/or transfers;A lot of help with bathing/dressing/bathroom;Assistance with feeding;Assist for transportation;Assistance with cooking/housework;Direct supervision/assist for medications management;Help with stairs or ramp for entrance;Direct supervision/assist for financial management;Supervision due to cognitive status   Can travel by private vehicle   No    Equipment Recommendations None recommended by PT (defer to next venue)  Recommendations for Other Services       Functional Status Assessment Patient has had a recent decline in their functional status and demonstrates the ability to make significant improvements in function in a reasonable and predictable amount of time.     Precautions / Restrictions Precautions Precautions: Fall Precaution/Restrictions Comments: O2 Restrictions Weight Bearing Restrictions Per Provider Order: No      Mobility  Bed Mobility Overal bed mobility: Needs Assistance       Supine to sit: Min assist, HOB elevated, Used rails (exit  L side of bed)          Transfers Overall transfer level: Needs assistance Equipment used: None Transfers: Bed to chair/wheelchair/BSC   Stand pivot transfers: Min assist (bed>recliner on L)              Ambulation/Gait                  Stairs            Wheelchair Mobility     Tilt Bed    Modified Rankin (Stroke Patients Only)       Balance Overall balance assessment: Needs assistance Sitting-balance support: No upper extremity supported, Feet supported Sitting balance-[name]: Fair Sitting balance - Comments: supervision static sitting EOB                                     Pertinent Vitals/Pain Pain Assessment Pain Assessment: No/denies pain    Home Living Family/patient expects to be discharged to:: Private residence Living Arrangements: Non-relatives/Friends Available Help at Discharge: Friend(s);Available 24 hours/day Type of Home: House Home Access: Stairs to enter Entrance Stairs-Rails: Can reach both Entrance Stairs-Number of Steps: 4   Home Layout: Able to live on main level with bedroom/bathroom Home Equipment: Tub bench;Rollator (4 wheels) Additional Comments: reports he lives in a house with several friends    Prior Function Prior Level of Function : Needs assist             Mobility Comments: uses rollator at all times ADLs Comments: can manage basic ADLs without A, friends help with IADLS     Extremity/Trunk Assessment   Upper Extremity Assessment Upper  Extremity Assessment: Generalized weakness    Lower Extremity Assessment Lower Extremity Assessment: Generalized weakness       Communication        Cognition Arousal: Alert Behavior During Therapy: Anxious   PT - Cognitive impairments: No family/caregiver present to determine baseline, Orientation, Awareness, Attention, Initiation, Memory, Sequencing, Problem solving, Safety/Judgement                       PT - Cognition  Comments: oriented to city, self Following commands: Impaired Following commands impaired: Follows one step commands with increased time, Follows one step commands inconsistently     Cueing Cueing Techniques: Verbal cues     General Comments General comments (skin integrity, edema, etc.): pt on supplemental O2 with SpO2 >90% throughout session but pt still endorsing SOB, I can't breathe with PT educating him on pursed lip breathing. PT provided pt with incentive spirometer & educated him on it's use but pt reports I can't do it without attempting.    Exercises     Assessment/Plan    PT Assessment Patient needs continued PT services  PT Problem List Decreased strength;Decreased coordination;Cardiopulmonary status limiting activity;Decreased range of motion;Decreased cognition;Decreased activity tolerance;Decreased knowledge of use of DME;Decreased balance;Decreased safety awareness;Decreased knowledge of precautions;Decreased mobility       PT Treatment Interventions Therapeutic exercise;DME instruction;Balance training;Neuromuscular re-education;Gait training;Functional mobility training;Cognitive remediation;Stair training;Therapeutic activities;Patient/family education    PT Goals (Current goals can be found in the Care Plan section)  Acute Rehab PT Goals Patient Stated Goal: breathe better PT Goal Formulation: With patient Time For Goal Achievement: 11/26/24 Potential to Achieve Goals: Good    Frequency Min 2X/week     Co-evaluation               AM-PAC PT 6 Clicks Mobility  Outcome Measure Help needed turning from your back to your side while in a flat bed without using bedrails?: None Help needed moving from lying on your back to sitting on the side of a flat bed without using bedrails?: A Little Help needed moving to and from a bed to a chair (including a wheelchair)?: A Little Help needed standing up from a chair using your arms (e.g., wheelchair or bedside  chair)?: A Little Help needed to walk in hospital room?: A Lot Help needed climbing 3-5 steps with a railing? : Total 6 Click Score: 16    End of Session Equipment Utilized During Treatment: Oxygen Activity Tolerance: Patient limited by fatigue Patient left: in chair;with chair alarm set;with call bell/phone within reach   PT Visit Diagnosis: Muscle weakness (generalized) (M62.81);Difficulty in walking, not elsewhere classified (R26.2)    Time: 8878-8864 PT Time Calculation (min) (ACUTE ONLY): 14 min   Charges:   PT Evaluation $PT Eval Moderate Complexity: 1 Mod   PT General Charges $$ ACUTE PT VISIT: 1 Visit         Richerd Pinal, PT, DPT 11/12/2024, 1:08 PM   Richerd CHRISTELLA Pinal 11/12/2024, 1:03 PM

## 2024-11-12 NOTE — Progress Notes (Signed)
 " PROGRESS NOTE Geoffrey Walker  FMW:[id number] DOB: 1960-12-28 DOA: 11/07/2024 PCP: [facility]  Chief Complaint  Patient presents with   Shortness of Breath   Hospital Course:  Geoffrey Walker is a 64 y.o. male with medical history significant of lung cancer status post pneumonectomy, COPD, chronic hypoxemic respiratory failure in the setting of COPD on 2 L of oxygen via nasal cannula, hypertension, chronic venous stasis, tobacco dependence presented with worsening shortness of breath since 1 week.  Workup revealed influenza positive. PT/OT recommending SNF.   Subjective: Patient reports feeling unwell today. States he feels short of breath. He is agreeable to going to SNF.   Objective: Blood pressure (!) 140/82, pulse 69, temperature 97.6 F (36.4 C), temperature source Oral, resp. rate 18, height 5' 10 (1.778 m), weight 85.8 kg, SpO2 100%.  Examination: Constitutional: NAD, on nasal cannula, able to complete sentences Respiratory: no wheezing. CTA L Cardiovascular: Regular rate and rhythm, no murmurs / rubs / gallops. No extremity edema.  Musculoskeletal: no clubbing / cyanosis. No joint deformity upper and lower extremities. Good ROM, no contractures. Normal muscle tone.  Skin: no rashes, lesions, ulcers. No induration Psychiatric: anxious  Assessment & Plan:  Acute on chronic hypoxemic respiratory failure in the setting of influenza A Baseline 2-5 L of oxygen via nasal cannula, requiring 4L [place] since he was working with PT. He states that he uses 5L with his concentrator at home. Breathing is about at baseline.  Chest x-ray negative for pneumonia, no indication for antibiotics Repeating chest xray to monitor progress.  Out of window for Tamiflu continue Duonebs q6 Stopped steroids Incentive spirometer and flutter valve Hydroxyzine  for anxiety PRN Wean oxygen as able, very anxious due to hx pneumonectomy Worked with OT who recommended SNF  Elevated  troponin Elevated BNP - Troponin flat.  Likely demand ischemia in the setting of sepsis. Downtrended   History of lung cancer status post right pneumonectomy: Follow up with Oncology outpatient   Hypokalemia:  Hypophosphatemia Repleted  Hypertension: On Coreg , will hold off hydrochlorothiazide  Hyperlipidemia: Continue statin   Tobacco abuse: - Reports he smokes 1 to 2 cigarettes/day.  Recommend cessation  DVT prophylaxis: Lovenox  SQ   Code Status: Full Code Disposition:  Home  Consultants:  None  Procedures:  None  Antimicrobials:  Anti-infectives (From admission, onward)    Start     Dose/Rate Route Frequency Ordered Stop   11/07/24 1515  vancomycin  (VANCOCIN ) IVPB 1000 mg/200 mL premix        1,000 mg 200 mL/hr over 60 Minutes Intravenous  Once 11/07/24 1511 11/07/24 1702   11/07/24 1515  ceFEPIme  (MAXIPIME ) 2 g in sodium chloride  0.9 % 100 mL IVPB        2 g 200 mL/hr over 30 Minutes Intravenous  Once 11/07/24 1511 11/07/24 1550   11/07/24 1515  doxycycline  (VIBRA -TABS) tablet 100 mg        100 mg Oral  Once 11/07/24 1511 11/07/24 1555      Data Reviewed: I have personally reviewed following labs and imaging studies CBC: Recent Labs  Lab 11/07/24 1450 11/07/24 1654 11/08/24 0430 11/09/24 0324 11/11/24 0436  WBC 15.4* 12.6* 3.9* 4.9 4.5  NEUTROABS 12.1*  --   --   --   --   HGB 12.4* 11.7* 10.4* 10.4* 10.5*  HCT 38.4* 36.4* 32.8* 32.1* 32.2*  MCV 90.6 90.1 91.1 90.7 88.7  PLT 281 235 226 234 218   Basic Metabolic Panel: Recent Labs  Lab 11/07/24  1450 11/07/24 1654 11/08/24 0430 11/09/24 0324 11/10/24 0425 11/11/24 0436  NA 140  --  138 137 134* 138  K 3.2*  --  4.4 4.2 4.6 4.8  CL 95*  --  99 99 97* 100  CO2 30  --  30 30 33* 35*  GLUCOSE 118*  --  173* 177* 155* 123*  BUN 13  --  15 20 22  24*  CREATININE 0.86  --  0.75 0.85 0.94 0.84  CALCIUM  9.0  --  8.5* 8.5* 8.4* 8.7*  MG  --  1.7  --  1.8 1.9  --   PHOS  --  1.8* 3.8 2.9 3.7  --      LOS: 5 days  MDM: Patient is high risk for one or more organ failure.  They necessitate ongoing hospitalization for continued IV therapies and subsequent lab monitoring. Total time spent interpreting labs and vitals, reviewing the medical record, coordinating care amongst consultants and care team members, directly assessing and discussing care with the patient and/or family: 35 min  Marien LITTIE Piety, MD [facility]  To contact the attending physician between 7A-7P please use Epic Chat. To contact the covering physician during after hours 7P-7A, please review Amion.  11/12/2024, 8:04 AM  "

## 2024-11-13 ENCOUNTER — Inpatient Hospital Stay: Payer: MEDICAID

## 2024-11-13 ENCOUNTER — Inpatient Hospital Stay
Admit: 2024-11-13 | Discharge: 2024-11-13 | Disposition: A | Discharge status: Home / Self Care | Payer: MEDICAID | Attending: Internal Medicine | Admitting: Internal Medicine

## 2024-11-13 ENCOUNTER — Inpatient Hospital Stay: Admit: 2024-11-13 | Payer: MEDICAID

## 2024-11-13 DIAGNOSIS — J09X1 Influenza due to identified novel influenza A virus with pneumonia: Secondary | ICD-10-CM

## 2024-11-13 DIAGNOSIS — J9621 Acute and chronic respiratory failure with hypoxia: Secondary | ICD-10-CM

## 2024-11-13 LAB — BLOOD GAS, ARTERIAL
Acid-Base Excess: 13.4 mmol/L — ABNORMAL HIGH (ref 0.0–2.0)
Bicarbonate: 40.5 mmol/L — ABNORMAL HIGH (ref 20.0–28.0)
Delivery systems: POSITIVE
Expiratory PAP: 6 cmH2O
FIO2: 50 %
Inspiratory PAP: 18 cmH2O
O2 Saturation: 99.4 %
Patient temperature: 37
pCO2 arterial: 61 mmHg — ABNORMAL HIGH (ref 32–48)
pH, Arterial: 7.43 (ref 7.35–7.45)
pO2, Arterial: 124 mmHg — ABNORMAL HIGH (ref 83–108)

## 2024-11-13 LAB — GLUCOSE, CAPILLARY: Glucose-Capillary: 148 mg/dL — ABNORMAL HIGH (ref 70–99)

## 2024-11-13 LAB — MRSA NEXT GEN BY PCR, NASAL: MRSA by PCR Next Gen: NOT DETECTED

## 2024-11-13 LAB — ECHOCARDIOGRAM COMPLETE
Height: 70 in
S' Lateral: 3.1 cm
Weight: 3209.9 [oz_av]

## 2024-11-13 MED ORDER — MORPHINE SULFATE (PF) 2 MG/ML IV SOLN
1.0000 mg | INTRAVENOUS | Status: DC | PRN
  Administered 2024-11-13 (×2): 1 mg via INTRAVENOUS
  Filled 2024-11-13 (×2): qty 1

## 2024-11-13 MED ORDER — LORAZEPAM 2 MG/ML IJ SOLN
0.5000 mg | Freq: Four times a day (QID) | INTRAMUSCULAR | Status: DC | PRN
  Administered 2024-11-13: 0.5 mg via INTRAVENOUS
  Filled 2024-11-13: qty 1

## 2024-11-13 MED ORDER — IOHEXOL 350 MG/ML SOLN
75.0000 mL | Freq: Once | INTRAVENOUS | Status: AC | PRN
  Administered 2024-11-13: 75 mL via INTRAVENOUS

## 2024-11-13 MED ORDER — ACETYLCYSTEINE 20 % IN SOLN
4.0000 mL | Freq: Three times a day (TID) | RESPIRATORY_TRACT | Status: DC
  Administered 2024-11-13 – 2024-11-17 (×12): 4 mL via RESPIRATORY_TRACT
  Filled 2024-11-13 (×15): qty 4

## 2024-11-13 MED ORDER — LORAZEPAM 2 MG/ML IJ SOLN
0.5000 mg | Freq: Once | INTRAMUSCULAR | Status: AC | PRN
  Administered 2024-11-13: 0.5 mg via INTRAVENOUS
  Filled 2024-11-13: qty 1

## 2024-11-13 MED ORDER — CHLORHEXIDINE GLUCONATE CLOTH 2 % EX PADS
6.0000 | MEDICATED_PAD | Freq: Every day | CUTANEOUS | Status: DC
  Administered 2024-11-13 – 2024-11-20 (×5): 6 via TOPICAL

## 2024-11-13 MED ORDER — METOPROLOL TARTRATE 5 MG/5ML IV SOLN
5.0000 mg | Freq: Two times a day (BID) | INTRAVENOUS | Status: DC
  Administered 2024-11-13: 5 mg via INTRAVENOUS
  Filled 2024-11-13: qty 5

## 2024-11-13 MED ORDER — METHYLPREDNISOLONE SODIUM SUCC 125 MG IJ SOLR
125.0000 mg | Freq: Once | INTRAMUSCULAR | Status: AC
  Administered 2024-11-13: 125 mg via INTRAVENOUS
  Filled 2024-11-13: qty 2

## 2024-11-13 MED ORDER — OSELTAMIVIR PHOSPHATE 75 MG PO CAPS
75.0000 mg | ORAL_CAPSULE | Freq: Two times a day (BID) | ORAL | Status: DC
  Filled 2024-11-13: qty 1

## 2024-11-13 MED ORDER — MORPHINE SULFATE (PF) 2 MG/ML IV SOLN
1.0000 mg | INTRAVENOUS | Status: DC | PRN
  Administered 2024-11-14 – 2024-11-20 (×16): 1 mg via INTRAVENOUS
  Filled 2024-11-13 (×17): qty 1

## 2024-11-13 MED ORDER — PANTOPRAZOLE SODIUM 40 MG IV SOLR
40.0000 mg | INTRAVENOUS | Status: DC
  Administered 2024-11-13 – 2024-11-15 (×3): 40 mg via INTRAVENOUS
  Filled 2024-11-13 (×3): qty 10

## 2024-11-13 MED ORDER — SODIUM CHLORIDE 0.9 % IV SOLN
2.0000 g | Freq: Three times a day (TID) | INTRAVENOUS | Status: AC
  Administered 2024-11-13 – 2024-11-18 (×15): 2 g via INTRAVENOUS
  Filled 2024-11-13 (×16): qty 12.5

## 2024-11-13 MED ORDER — FUROSEMIDE 10 MG/ML IJ SOLN
40.0000 mg | Freq: Once | INTRAMUSCULAR | Status: AC
  Administered 2024-11-13: 40 mg via INTRAVENOUS
  Filled 2024-11-13: qty 4

## 2024-11-13 MED ORDER — METHYLPREDNISOLONE SODIUM SUCC 40 MG IJ SOLR
20.0000 mg | INTRAMUSCULAR | Status: DC
  Administered 2024-11-13 – 2024-11-16 (×4): 20 mg via INTRAVENOUS
  Filled 2024-11-13 (×4): qty 1

## 2024-11-13 NOTE — Plan of Care (Signed)

## 2024-11-13 NOTE — Consult Note (Signed)
" ° °  NAME:  Geoffrey Walker, MRN:  [id number], DOB:  [birth year], LOS: 6 ADMISSION DATE:  11/07/2024, CONSULTATION DATE:  11/13/2024 REFERRING MD:  Dr. Lenon, CHIEF COMPLAINT:  Acute hypoxic respiratory failure   History of Present Illness:  This is a case of a 64 year old male patient with a past medical history of lung cancer status post right pneumonectomy at [facility], COPD, hypertension, hyperlipidemia presenting to [facility] 6 days ago COPD exacerbation secondary to influenza A.  He was improving and was moving to SNF however on 01/01 had a choking episode and possibly aspirated leading to worsening respiratory status and increased oxygen requirement.  Pulmonary team has been consulted for help with further management.  CTA chest obtained was negative for PE and showed multiple small consolidative opacities throughout the left lung.  No signs of overt consolidation nor pulmonary edema.  Started on cefepime  by the primary team.  Reinitiated Solu-Medrol  20 mg IV daily.  Objective    Blood pressure 135/85, pulse 82, temperature 97.7 F (36.5 C), temperature source Axillary, resp. rate 15, height 5' 10 (1.778 m), weight 91 kg, SpO2 100%.    FiO2 (%):  [35 %-80 %] 55 %   Intake/Output Summary (Last 24 hours) at 11/13/2024 1645 Last data filed at 11/13/2024 1021 Gross per 24 hour  Intake 50 ml  Output 2400 ml  Net -2350 ml   Filed Weights   11/07/24 1507 11/13/24 1015  Weight: 85.8 kg 91 kg   GEN acutely ill but follows commands HEENT supple neck, reactive pupils CVS normal S1, normal S2, no murmurs or extra sounds appreciated Lungs diffuse lung crackles across the left hemithorax. 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  Was improving but  had an aspiration event on 01/01 with worsening respiratory status requiring increased oxygen support.  I agree with current excellent management per the primary team including starting antibiotics and IV Solu-Medrol  20 mg daily.  Continuing with DuoNebs every 8 hours.  I would like to add aggressive chest PT with Mucomyst preceded by albuterol  and bed vibration every 8 hours to help clear his secretions. In addition, When laying in bed he should lay on his left side to help with oxygenation given right pneumonectomy.  Pulmonary will follow along  I personally spent a total of 80 minutes in the care of the patient today including preparing to see the patient, getting/reviewing separately obtained history, performing a medically appropriate exam/evaluation, counseling and educating, placing orders, documenting clinical information in the EHR, independently interpreting results, and communicating results.   Darrin Barn, MD [facility] 11/13/2024 4:47 PM     "

## 2024-11-13 NOTE — Progress Notes (Addendum)
 RT assisted with patient transport from 2A to ICU with no complications. Pt transported on V60 and changed to [facility] once in ICU. Total time RT with patient: .

## 2024-11-13 NOTE — Progress Notes (Addendum)
 SLP Cancellation Note  Patient Details Name: Geoffrey Walker MRN: [id number] DOB: [birth year]   Cancelled treatment:       Reason Eval/Treat Not Completed: Patient not medically ready;Medical issues which prohibited therapy  Pt has had a decline in his respiratory/medical status since last night. He is currently on BIPAP after being unable to tolerated 5L [place](O2 at home baseline). Pt is being transferred to [facility] CCU per NSG d/t his change in status.  He is NPO. Recommend frequent oral care for hygiene and stimulation of swallowing.    Per NSG, pt has been eating a Regular diet w/ thins since his admit ~5 days ago w/ No difficulty- even swallowing a whole cup of Pills at one time. He reportedly choked on a Musinex tablet last night.  ST services will f/u w/ pt's status tomorrow to determine appropriateness for po assessment. Recommend any po meds to be given in a Puree for safer swallowing moving forward d/t his Baseline pulmonary status.      Comer Portugal, MS, CCC-SLP Speech Language Pathologist Rehab Services; [facility] [phone] (ascom) Lotus Santillo 11/13/2024, 9:20 AM

## 2024-11-13 NOTE — Progress Notes (Signed)
 Patient complain of hard to breath currently on 92% on 5L [place]. ICU nurse and RT seen this patient, patient was placed on BIPAP, CXR done. IV solumedrol given.

## 2024-11-13 NOTE — Progress Notes (Signed)
 OT Cancellation Note  Patient Details Name: Geoffrey Walker MRN: [id number] DOB: 04/07/1961   Cancelled Treatment:    Reason Eval/Treat Not Completed: Medical issues which prohibited therapy. Chart reviewed - pt has transitioned to higher level of care. Per therapy protocols, will require new orders or continue at transfer orders to continue therapy services. Will follow acutely and see pt as medically appropriate.   Elston Slot, M.S. OTR/L  11/13/2024, 10:31 AM  ascom [phone]

## 2024-11-13 NOTE — Progress Notes (Signed)
 RT assisted with patient transport to CT and back to 2A with no complications. Pt transported on V60 bipap. Total time RT with pt: 30 Min.

## 2024-11-13 NOTE — Progress Notes (Addendum)
 " PROGRESS NOTE Geoffrey Walker  FMW:[id number] DOB: 1960/11/14 DOA: 11/07/2024 PCP: [facility]  Chief Complaint  Patient presents with   Shortness of Breath   Hospital Course:  Geoffrey Walker is a 64 y.o. male with medical history significant of lung cancer status post pneumonectomy, COPD, chronic hypoxemic respiratory failure in the setting of COPD on 2 L of oxygen via nasal cannula, hypertension, chronic venous stasis, tobacco dependence presented with worsening shortness of breath since 1 week.  Workup revealed influenza positive. He had some initial progress for improvement and was moving toward dc to SNF.  However, had choking event evening of 1/1 that may have resulted in aspiration.  His O2 status which was stable on 5L up until today was now progressing to needing bipap at 45% and feeling unwell. CTA was negative for PE. Suspicious to me for aspiration PNA vs HAP.  Started on cefepime . Transferred to stepdown and consulted pulmonology.  He is currently NPO other than meds in puree.   Subjective: Patient reports feeling unwell today. He is having trouble breathing and asks repeatedly for me to help him.   Objective: Blood pressure (!) 140/82, pulse 69, temperature 97.6 F (36.4 C), temperature source Oral, resp. rate 18, height 5' 10 (1.778 m), weight 85.8 kg, SpO2 100%.  Examination: Constitutional: resting on bipap. Unwell appearing Respiratory: no wheezing. Course breath sounds on L. Increased WOB. Accessory muscle use Cardiovascular: Regular rate and rhythm, no murmurs / rubs / gallops. No extremity edema.  Musculoskeletal: clubbing. No cyanosis. No joint deformity upper and lower extremities. Good ROM, no contractures. Normal muscle tone.  Skin: no rashes, lesions, ulcers. No induration Psychiatric: anxious  Assessment & Plan:  Acute on chronic hypoxemic respiratory failure in the setting of influenza A H/o lung cancer s/p R pneumonectomy Baseline 2-5 L of  oxygen via nasal cannula. He states that he uses 5L with his concentrator at home.  Chest x-ray negative for pneumonia on admission and on repeat yesterday. Had no desats and was working toward dc to SNF until had choking episode on a pill yesterday. Has had progressively worsening dyspnea and hypoxia since then.  CTA this morning was negative for PE. Shows stable post-op R side and scattered opacities on the left lung. Considerable emphysema. Out of window for Tamiflu - continue Duonebs q6 - starting cefepime  for concern of aspiration vs HAP. MRSA swab neg - Incentive spirometer and flutter valve - wean O2 as tolerated - Hydroxyzine  for anxiety PRN, or ativan - Morphine PRN for air hunger - pulmonology consulted and moved to stepdown. Appreciate your input.  - SLP eval - may consider diuretics  Elevated troponin Elevated BNP - Troponin flat.  Likely demand ischemia in the setting of sepsis. Downtrended    Hypokalemia:  Hypophosphatemia Repleted  Hypertension: On Coreg , will hold off hydrochlorothiazide Temporarily on IV metoprolol while on bipap and not able to take PO tablets  Hyperlipidemia: Continue statin   Tobacco abuse: - Reports he smokes 1 to 2 cigarettes/day.  Recommend cessation  DVT prophylaxis: Lovenox  SQ   Code Status: Full Code Disposition:  Home  Consultants:  pulmonology   Procedures:  None  Antimicrobials:  Anti-infectives (From admission, onward)    Start     Dose/Rate Route Frequency Ordered Stop   11/07/24 1515  vancomycin  (VANCOCIN ) IVPB 1000 mg/200 mL premix        1,000 mg 200 mL/hr over 60 Minutes Intravenous  Once 11/07/24 1511 11/07/24 1702   11/07/24 1515  ceFEPIme  (MAXIPIME ) 2 g in sodium chloride  0.9 % 100 mL IVPB        2 g 200 mL/hr over 30 Minutes Intravenous  Once 11/07/24 1511 11/07/24 1550   11/07/24 1515  doxycycline  (VIBRA -TABS) tablet 100 mg        100 mg Oral  Once 11/07/24 1511 11/07/24 1555      Data Reviewed: I have  personally reviewed following labs and imaging studies CBC: Recent Labs  Lab 11/07/24 1450 11/07/24 1654 11/08/24 0430 11/09/24 0324 11/11/24 0436  WBC 15.4* 12.6* 3.9* 4.9 4.5  NEUTROABS 12.1*  --   --   --   --   HGB 12.4* 11.7* 10.4* 10.4* 10.5*  HCT 38.4* 36.4* 32.8* 32.1* 32.2*  MCV 90.6 90.1 91.1 90.7 88.7  PLT 281 235 226 234 218   Basic Metabolic Panel: Recent Labs  Lab 11/07/24 1450 11/07/24 1654 11/08/24 0430 11/09/24 0324 11/10/24 0425 11/11/24 0436  NA 140  --  138 137 134* 138  K 3.2*  --  4.4 4.2 4.6 4.8  CL 95*  --  99 99 97* 100  CO2 30  --  30 30 33* 35*  GLUCOSE 118*  --  173* 177* 155* 123*  BUN 13  --  15 20 22  24*  CREATININE 0.86  --  0.75 0.85 0.94 0.84  CALCIUM  9.0  --  8.5* 8.5* 8.4* 8.7*  MG  --  1.7  --  1.8 1.9  --   PHOS  --  1.8* 3.8 2.9 3.7  --     LOS: 6 days  MDM: Patient is high risk for one or more organ failure.  They necessitate ongoing hospitalization for continued IV therapies and subsequent lab monitoring. Total time spent interpreting labs and vitals, reviewing the medical record, coordinating care amongst consultants and care team members, directly assessing and discussing care with the patient and/or family: 55 min  Marien LITTIE Piety, MD [facility]  To contact the attending physician between 7A-7P please use Epic Chat. To contact the covering physician during after hours 7P-7A, please review Amion.  11/13/2024, 7:56 AM  "

## 2024-11-13 NOTE — Progress Notes (Signed)
 PT Cancellation Note  Patient Details Name: Seraphim Trow MRN: [id number] DOB: 1961/01/22   Cancelled Treatment:     Pt has had change in status.He is being transferred to high level of care. Will hold PT at this time. Will need continue on transfers orders or new PT orders to resume acute PT.    Rankin KATHEE Essex 11/13/2024, 9:48 AM

## 2024-11-14 DIAGNOSIS — J9621 Acute and chronic respiratory failure with hypoxia: Secondary | ICD-10-CM | POA: Diagnosis not present

## 2024-11-14 LAB — BASIC METABOLIC PANEL WITH GFR
Anion gap: 7 (ref 5–15)
Anion gap: 8 (ref 5–15)
BUN: 33 mg/dL — ABNORMAL HIGH (ref 8–23)
BUN: 35 mg/dL — ABNORMAL HIGH (ref 8–23)
CO2: 37 mmol/L — ABNORMAL HIGH (ref 22–32)
CO2: 37 mmol/L — ABNORMAL HIGH (ref 22–32)
Calcium: 8.5 mg/dL — ABNORMAL LOW (ref 8.9–10.3)
Calcium: 8.5 mg/dL — ABNORMAL LOW (ref 8.9–10.3)
Chloride: 94 mmol/L — ABNORMAL LOW (ref 98–111)
Chloride: 97 mmol/L — ABNORMAL LOW (ref 98–111)
Creatinine, Ser: 0.85 mg/dL (ref 0.61–1.24)
Creatinine, Ser: 0.8 mg/dL (ref 0.61–1.24)
GFR, Estimated: >60 mL/min
GFR, Estimated: >60 mL/min
Glucose, Bld: 150 mg/dL — ABNORMAL HIGH (ref 70–99)
Glucose, Bld: 122 mg/dL — ABNORMAL HIGH (ref 70–99)
Potassium: 4.5 mmol/L (ref 3.5–5.1)
Potassium: 4.6 mmol/L (ref 3.5–5.1)
Sodium: 137 mmol/L (ref 135–145)
Sodium: 141 mmol/L (ref 135–145)

## 2024-11-14 LAB — BLOOD GAS, VENOUS
Acid-Base Excess: 13.9 mmol/L — ABNORMAL HIGH (ref 0.0–2.0)
Bicarbonate: 41.2 mmol/L — ABNORMAL HIGH (ref 20.0–28.0)
FIO2: 50 %
O2 Content: 50 L/min
O2 Saturation: 85.8 %
Patient temperature: 37
pCO2, Ven: 62 mmHg — ABNORMAL HIGH (ref 44–60)
pH, Ven: 7.43 (ref 7.25–7.43)
pO2, Ven: 56 mmHg — ABNORMAL HIGH (ref 32–45)

## 2024-11-14 LAB — CBC
HCT: 35 % — ABNORMAL LOW (ref 39.0–52.0)
Hemoglobin: 11.7 g/dL — ABNORMAL LOW (ref 13.0–17.0)
MCH: 29.2 pg (ref 26.0–34.0)
MCHC: 33.4 g/dL (ref 30.0–36.0)
MCV: 87.3 fL (ref 80.0–100.0)
Platelets: 184 K/uL (ref 150–400)
RBC: 4.01 MIL/uL — ABNORMAL LOW (ref 4.22–5.81)
RDW: 12.7 % (ref 11.5–15.5)
WBC: 4.6 K/uL (ref 4.0–10.5)
nRBC: 0 % (ref 0.0–0.2)

## 2024-11-14 LAB — GLUCOSE, CAPILLARY: Glucose-Capillary: 117 mg/dL — ABNORMAL HIGH (ref 70–99)

## 2024-11-14 LAB — MAGNESIUM: Magnesium: 2.1 mg/dL (ref 1.7–2.4)

## 2024-11-14 LAB — PHOSPHORUS: Phosphorus: 4.1 mg/dL (ref 2.5–4.6)

## 2024-11-14 MED ORDER — SODIUM CHLORIDE 0.9 % IV SOLN: INTRAVENOUS | Status: DC

## 2024-11-14 MED ORDER — INSULIN ASPART 100 UNIT/ML IJ SOLN: 0.0000 [IU] | Freq: Three times a day (TID) | INTRAMUSCULAR | Status: DC

## 2024-11-14 NOTE — Progress Notes (Signed)
 " PROGRESS NOTE Geoffrey Walker  FMW:[id number] DOB: November 14, 1960 DOA: 11/07/2024 PCP: [facility]  Chief Complaint  Patient presents with   Shortness of Breath   Hospital Course:  Geoffrey Walker is a 64 y.o. male with medical history significant of lung cancer status post pneumonectomy, COPD, chronic hypoxemic respiratory failure in the setting of COPD on 2 L of oxygen via nasal cannula, hypertension, chronic venous stasis, tobacco dependence presented with worsening shortness of breath since 1 week.  Workup revealed influenza positive. He had some initial progress for improvement and was moving toward dc to SNF.  However, had choking event evening of 1/1 that may have resulted in aspiration.  His O2 status which was stable on 5L progressing to needing bipap at 45% and feeling unwell. CTA was negative for PE. Suspicious to me for aspiration PNA vs HAP.  Started on cefepime . Transferred to stepdown and consulted pulmonology 1/2.  He is currently NPO other than meds in puree.   Subjective: Patient resting this am and appears comfortable on HHFNC 40/40  Objective: Blood pressure (!) 140/82, pulse 69, temperature 97.6 F (36.4 C), temperature source Oral, resp. rate 18, height 5' 10 (1.778 m), weight 85.8 kg, SpO2 100%.  Examination: Constitutional: resting on HHFNC. Appears comfortable Respiratory: no wheezing. Course breath sounds on L. normal WOB. No accessory muscle use Cardiovascular: Regular rate and rhythm, no murmurs / rubs / gallops. No extremity edema.  Musculoskeletal: clubbing. No cyanosis. No joint deformity upper and lower extremities. Good ROM, no contractures. Normal muscle tone.  Skin: no rashes, lesions, ulcers. No induration Psychiatric: anxious  Assessment & Plan:  Acute on chronic hypoxemic respiratory failure in the setting of influenza A H/o lung cancer s/p R pneumonectomy Baseline 2-5 L of oxygen via nasal cannula. He states that he uses 5L with his  concentrator at home.  Chest x-ray negative for pneumonia on admission and on repeat. Had no desats and was working toward dc to SNF until had choking episode on a pill 1/1. Has had progressively worsening dyspnea and hypoxia since then.  CTA 1/2 negative for PE. Shows stable post-op R side and scattered opacities on the left lung. Considerable emphysema. Out of window for Tamiflu  - continue Duonebs q6 - starting cefepime  for concern of aspiration vs HAP. MRSA swab neg - Incentive spirometer and flutter valve - wean O2 as tolerated - Hydroxyzine  for anxiety PRN, or ativan  - Morphine  PRN for air hunger - pulmonology consulted and moved to stepdown. Appreciate your input.   - aggressive chest physiotherapy  - SLP eval  - recommending remain NPO and crushed pills with puree. He was not able to participate much with the eval with lethargy/somnolence  - may consider diuretics  Elevated troponin Elevated BNP - Troponin flat.  Likely demand ischemia in the setting of sepsis. Downtrended    Hypokalemia:  Hypophosphatemia Repleted  Hypertension: On Coreg , will hold off hydrochlorothiazide Temporarily on IV metoprolol  while on bipap and not able to take PO tablets  Hyperlipidemia: Continue statin   Tobacco abuse: - Reports he smokes 1 to 2 cigarettes/day.  Recommend cessation  DVT prophylaxis: Lovenox  SQ   Code Status: Full Code Disposition:  Home  Consultants:  pulmonology   Procedures:  None  Antimicrobials:  Anti-infectives (From admission, onward)    Start     Dose/Rate Route Frequency Ordered Stop   11/13/24 1430  ceFEPIme  (MAXIPIME ) 2 g in sodium chloride  0.9 % 100 mL IVPB  2 g 200 mL/hr over 30 Minutes Intravenous Every 8 hours 11/13/24 1342 11/18/24 1429   11/13/24 1130  oseltamivir  (TAMIFLU ) capsule 75 mg  Status:  Discontinued        75 mg Oral 2 times daily 11/13/24 1037 11/13/24 1437   11/07/24 1515  vancomycin  (VANCOCIN ) IVPB 1000 mg/200 mL premix         1,000 mg 200 mL/hr over 60 Minutes Intravenous  Once 11/07/24 1511 11/07/24 1702   11/07/24 1515  ceFEPIme  (MAXIPIME ) 2 g in sodium chloride  0.9 % 100 mL IVPB        2 g 200 mL/hr over 30 Minutes Intravenous  Once 11/07/24 1511 11/07/24 1550   11/07/24 1515  doxycycline  (VIBRA -TABS) tablet 100 mg        100 mg Oral  Once 11/07/24 1511 11/07/24 1555      Data Reviewed: I have personally reviewed following labs and imaging studies CBC: Recent Labs  Lab 11/07/24 1450 11/07/24 1654 11/08/24 0430 11/09/24 0324 11/11/24 0436 11/14/24 0354  WBC 15.4* 12.6* 3.9* 4.9 4.5 4.6  NEUTROABS 12.1*  --   --   --   --   --   HGB 12.4* 11.7* 10.4* 10.4* 10.5* 11.7*  HCT 38.4* 36.4* 32.8* 32.1* 32.2* 35.0*  MCV 90.6 90.1 91.1 90.7 88.7 87.3  PLT 281 235 226 234 218 184   Basic Metabolic Panel: Recent Labs  Lab 11/07/24 1654 11/08/24 0430 11/09/24 0324 11/10/24 0425 11/11/24 0436 11/14/24 0354  NA  --  138 137 134* 138 137  K  --  4.4 4.2 4.6 4.8 4.5  CL  --  99 99 97* 100 94*  CO2  --  30 30 33* 35* 37*  GLUCOSE  --  173* 177* 155* 123* 150*  BUN  --  15 20 22  24* 33*  CREATININE  --  0.75 0.85 0.94 0.84 0.85  CALCIUM   --  8.5* 8.5* 8.4* 8.7* 8.5*  MG 1.7  --  1.8 1.9  --  2.1  PHOS 1.8* 3.8 2.9 3.7  --  4.1    LOS: 7 days  MDM: Patient is high risk for one or more organ failure.  They necessitate ongoing hospitalization for continued IV therapies and subsequent lab monitoring. Total time spent interpreting labs and vitals, reviewing the medical record, coordinating care amongst consultants and care team members, directly assessing and discussing care with the patient and/or family: 55 min  Marien LITTIE Piety, MD [facility]  To contact the attending physician between 7A-7P please use Epic Chat. To contact the covering physician during after hours 7P-7A, please review Amion.  11/14/2024, 8:05 AM  "

## 2024-11-14 NOTE — Progress Notes (Signed)
 PT Cancellation Note  Patient Details Name: Geoffrey Walker MRN: [id number] DOB: 1961-05-29   Cancelled Treatment:    Reason Eval/Treat Not Completed: Medical issues which prohibited therapy (Patient transferred to higher level of care and will need new PT order when appropriate. Will sign off at this time.)  Randine Essex, PT, MPT   Randine LULLA Essex 11/14/2024, 11:29 AM

## 2024-11-14 NOTE — Evaluation (Signed)
 Clinical/Bedside Swallow Evaluation Patient Details  Name: Geoffrey Walker MRN: [id number] Date of Birth: 1961/10/01  Today's Date: 11/14/2024 Time: SLP Start Time (ACUTE ONLY): 0945 SLP Stop Time (ACUTE ONLY): 1015 SLP Time Calculation (min) (ACUTE ONLY): 30 min  Past Medical History:  Past Medical History:  Diagnosis Date   High cholesterol    Hypertension    Pneumonia    Past Surgical History:  Past Surgical History:  Procedure Laterality Date   BACK SURGERY     CHEST TUBE INSERTION     LEG SURGERY Right    HPI:  Per Physician Progress Note Geoffrey Walker is a 64 y.o. male with medical history significant of lung cancer status post pneumonectomy, COPD, chronic hypoxemic respiratory failure in the setting of COPD on 2 L of oxygen via nasal cannula, hypertension, chronic venous stasis, tobacco dependence presented with worsening shortness of breath since 1 week Choking event 1/1- initially on bipap. CTA was negative for PE. Suspicious to me for aspiration PNA vs HAP.    Assessment / Plan / Recommendation  Clinical Impression  Pt seen for bedside swallow assessment in the setting of acute on chronic hypoxemic respiratory failure secondary to flu now following choking event and increased O2 needs. Pt with history of dysphagia work up. Most recent BSE completed at OSH on 07/27/24- revealing grossly functional swallow function with recommendation of thin liquids and regular solids. MBSS completed at OSH 09/2023 revealing grossly functional oropharyngeal deglutition. Noted esophageal complaints during that admission- with EGD completed during that admission revealing, no endoscopic esophageal abnormality to explain patient's dysphagia was noted. Esophagus was dilated to a maximal diameter of 18 mm. No follow up GI services noted in chart review. When prompted to identify if globus sensation persists, pt nodded without providing detail this date. RN reporting pt with hx of impulsivity with  medication administration (taking all medications at one time) previously during admission, potentially contributing to choking event with medication on 1/1.  During assessment, pt keeping eyes closed, grossly lethargic, intermittently following commands and answering questions. High flow O2 in place at 45L and 45 FIO2- O2 maintained at 100 for duration of session. Pt seen with trials of tice chips and tsp of water. No overt or subtle s/sx pharyngeal dysphagia noted. Given pt's gross disengagement in task/lethargy, no further trials completed. Recommend continuation of medications crushed and allowance of water/ice following oral care. MD and RN aware of recommendations. SLP will closely monitor for readiness for PO vs need for instrumental assessment.   SLP Visit Diagnosis: Dysphagia, unspecified (R13.10) (related to acute pulm/respiratory decline and deconditioning)    Aspiration Risk  Moderate aspiration risk    Diet Recommendation   NPO  Medication Administration: Crushed with puree    Other Recommendations Oral Care Recommendations: Oral care prior to ice chip/H20;Staff/trained caregiver to provide oral care      Functional Status Assessment Patient has had a recent decline in their functional status and demonstrates the ability to make significant improvements in function in a reasonable and predictable amount of time.  Frequency and Duration min 2x/week  2 weeks       Prognosis Prognosis for improved oropharyngeal function: Fair Barriers to Reach Goals:  (level of acute deconditioning)      Swallow Study   General Date of Onset: 11/14/24 HPI: Per Physician Progress Note [name] is a 64 y.o. male with medical history significant of lung cancer status post pneumonectomy, COPD, chronic hypoxemic respiratory failure in the setting of COPD on  2 L of oxygen via nasal cannula, hypertension, chronic venous stasis, tobacco dependence presented with worsening shortness of breath  since 1 week Choking event 1/1- initially on bipap. CTA was negative for PE. Suspicious to me for aspiration PNA vs HAP. Type of Study: Bedside Swallow Evaluation Previous Swallow Assessment: yes at OSH Diet Prior to this Study: NPO Temperature Spikes Noted: No (WBC 4.6) Respiratory Status: Nasal cannula (highflow- 45L and 45% fiO2) History of Recent Intubation: No Behavior/Cognition: Lethargic/Drowsy;Distractible Oral Cavity Assessment: Dry Oral Care Completed by SLP: Yes Oral Cavity - Dentition: Adequate natural dentition Vision:  (eyes closed for majority of trials) Self-Feeding Abilities: Needs assist Patient Positioning: Upright in bed Baseline Vocal Quality: Low vocal intensity Volitional Cough: Weak Volitional Swallow: Able to elicit    Oral/Motor/Sensory Function Overall Oral Motor/Sensory Function: Within functional limits   Ice Chips Ice chips: Within functional limits Presentation: Spoon   Thin Liquid Thin Liquid: Within functional limits Presentation: Spoon    Nectar Thick Nectar Thick Liquid: Not tested   Honey Thick Honey Thick Liquid: Not tested   Puree Puree: Not tested   Solid     Solid: Not tested     Geoffrey Russey Clapp, MS, CCC-SLP Speech Language Pathologist Rehab Services; [facility] [place] [phone] (ascom)   Lyndon Chenoweth J Walker 11/14/2024,11:19 AM

## 2024-11-15 ENCOUNTER — Inpatient Hospital Stay: Payer: MEDICAID

## 2024-11-15 DIAGNOSIS — J9621 Acute and chronic respiratory failure with hypoxia: Secondary | ICD-10-CM | POA: Diagnosis not present

## 2024-11-15 DIAGNOSIS — J09X1 Influenza due to identified novel influenza A virus with pneumonia: Secondary | ICD-10-CM | POA: Diagnosis not present

## 2024-11-15 LAB — CBC
HCT: 32.5 % — ABNORMAL LOW (ref 39.0–52.0)
Hemoglobin: 11.1 g/dL — ABNORMAL LOW (ref 13.0–17.0)
MCH: 29.7 pg (ref 26.0–34.0)
MCHC: 34.2 g/dL (ref 30.0–36.0)
MCV: 86.9 fL (ref 80.0–100.0)
Platelets: 154 K/uL (ref 150–400)
RBC: 3.74 MIL/uL — ABNORMAL LOW (ref 4.22–5.81)
RDW: 12.9 % (ref 11.5–15.5)
WBC: 10.6 K/uL — ABNORMAL HIGH (ref 4.0–10.5)
nRBC: 0 % (ref 0.0–0.2)

## 2024-11-15 LAB — GLUCOSE, CAPILLARY
Glucose-Capillary: 120 mg/dL — ABNORMAL HIGH (ref 70–99)
Glucose-Capillary: 142 mg/dL — ABNORMAL HIGH (ref 70–99)
Glucose-Capillary: 123 mg/dL — ABNORMAL HIGH (ref 70–99)

## 2024-11-15 LAB — PHOSPHORUS: Phosphorus: 3.1 mg/dL (ref 2.5–4.6)

## 2024-11-15 LAB — MAGNESIUM: Magnesium: 2.2 mg/dL (ref 1.7–2.4)

## 2024-11-15 MED ORDER — GADOBUTROL 1 MMOL/ML IV SOLN
9.0000 mL | Freq: Once | INTRAVENOUS | Status: AC | PRN
  Administered 2024-11-15: 9 mL via INTRAVENOUS

## 2024-11-15 NOTE — NC FL2 (Signed)
 " [place]  MEDICAID FL2 LEVEL OF CARE FORM     IDENTIFICATION  Patient Name: Geoffrey Walker Birthdate: [birth year] Sex: male Admission Date (Current Location): 11/07/2024  [facility] and [place] Number:  Chiropodist and Address:         Provider Number: [id number]  Attending Physician Name and Address:  Lenon Marien CROME, MD  Relative Name and Phone Number:       Current Level of Care: Hospital Recommended Level of Care: Skilled Nursing Facility Prior Approval Number:    Date Approved/Denied:   PASRR Number: [id number]  Discharge Plan: SNF    Current Diagnoses: Patient Active Problem List   Diagnosis Date Noted   Acute on chronic hypoxic respiratory failure (HCC) 11/07/2024    Orientation RESPIRATION BLADDER Height & Weight     Self, Time, Situation, Place  Normal Continent Weight: 91 kg Height:  5' 10 (177.8 cm)  BEHAVIORAL SYMPTOMS/MOOD NEUROLOGICAL BOWEL NUTRITION STATUS      Continent Diet  AMBULATORY STATUS COMMUNICATION OF NEEDS Skin   Limited Assist Verbally Normal                       Personal Care Assistance Level of Assistance  Dressing     Dressing Assistance: Limited assistance     Functional Limitations Info             SPECIAL CARE FACTORS FREQUENCY  PT (By licensed PT), OT (By licensed OT)     PT Frequency: 3X/Week OT Frequency: 3X/Week            Contractures Contractures Info: Not present    Additional Factors Info  Code Status Code Status Info: Full             Current Medications (11/15/2024):  This is the current hospital active medication list Current Facility-Administered Medications  Medication Dose Route Frequency Provider Last Rate Last Admin   acetaminophen  (TYLENOL ) tablet 650 mg  650 mg Oral Q6H PRN Pahwani, Rinka R, MD   650 mg at 11/15/24 0518   Or   acetaminophen  (TYLENOL ) suppository 650 mg  650 mg Rectal Q6H PRN Pahwani, Rinka R, MD       acetylcysteine  (MUCOMYST ) 20 % nebulizer /  oral solution 4 mL  4 mL Nebulization TID Assaker, Darrin, MD   4 mL at 11/15/24 1350   albuterol  (PROVENTIL ) (2.5 MG/3ML) 0.083% nebulizer solution 2.5 mg  2.5 mg Nebulization Q2H PRN Pahwani, Rinka R, MD   2.5 mg at 11/13/24 0158   carvedilol  (COREG ) tablet 12.5 mg  12.5 mg Oral BID WC Pahwani, Rinka R, MD   12.5 mg at 11/15/24 0803   ceFEPIme  (MAXIPIME ) 2 g in sodium chloride  0.9 % 100 mL IVPB  2 g Intravenous Q8H Lenon Marien L, MD 200 mL/hr at 11/15/24 1516 2 g at 11/15/24 1516   Chlorhexidine  Gluconate Cloth 2 % PADS 6 each  6 each Topical Daily Lenon Marien CROME, MD   6 each at 11/15/24 1227   enoxaparin  (LOVENOX ) injection 40 mg  40 mg Subcutaneous Q24H Pahwani, Rinka R, MD   40 mg at 11/14/24 2134   hydrOXYzine  (ATARAX ) tablet 50 mg  50 mg Oral TID PRN Anderson, Chelsey L, MD   50 mg at 11/15/24 1103   insulin  aspart (novoLOG ) injection 0-6 Units  0-6 Units Subcutaneous TID WC Lenon Marien CROME, MD       ipratropium-albuterol  (DUONEB) 0.5-2.5 (3) MG/3ML nebulizer solution 3 mL  3 mL Nebulization  TID Jerelene Critchley, MD   3 mL at 11/15/24 1350   LORazepam  (ATIVAN ) injection 0.5 mg  0.5 mg Intravenous Q6H PRN Anderson, Chelsey L, MD   0.5 mg at 11/13/24 1855   menthol  (CEPACOL) lozenge 3 mg  1 lozenge Oral PRN Ponnala, Shruthi, MD   3 mg at 11/11/24 1708   methylPREDNISolone  sodium succinate (SOLU-MEDROL ) 40 mg/mL injection 20 mg  20 mg Intravenous Q24H Assaker, Darrin, MD   20 mg at 11/14/24 1626   morphine  (PF) 2 MG/ML injection 1 mg  1 mg Intravenous Q3H PRN Anderson, Chelsey L, MD   1 mg at 11/15/24 1507   ondansetron  (ZOFRAN ) tablet 4 mg  4 mg Oral Q6H PRN Pahwani, Rinka R, MD       Or   ondansetron  (ZOFRAN ) injection 4 mg  4 mg Intravenous Q6H PRN Pahwani, Rinka R, MD   4 mg at 11/08/24 0221   pantoprazole  (PROTONIX ) injection 40 mg  40 mg Intravenous Q24H Lenon Marien CROME, MD   40 mg at 11/15/24 1049   traZODone  (DESYREL ) tablet 25 mg  25 mg Oral QHS PRN Mansy,  Jan A, MD   25 mg at 11/14/24 2134     Discharge Medications: Please see discharge summary for a list of discharge medications.  Relevant Imaging Results:  Relevant Lab Results:   Additional Information: [id number]    Victory Jackquline RAMAN, RN     "

## 2024-11-15 NOTE — Progress Notes (Signed)
 Speech Language Pathology Treatment: Dysphagia  Patient Details Name: Geoffrey Walker MRN: [id number] DOB: March 02, 1961 Today's Date: 11/15/2024 Time: 8944-8896 SLP Time Calculation (min) (ACUTE ONLY): 8 min  Assessment / Plan / Recommendation Clinical Impression  Pt seen for ongoing trials of POs in the hopes of returning pt to baseline diet of regular with thin liquids. Pt was awake, alert able to communicate wants/needs, currently on 2L O2 via [place]. Pt requested regular [place] but was agreeable to trials of water via straw prior to consuming soda.   Pt demonstrated adequate oropharyngeal abilities when consuming thin liquids via straw (~ 16 oz) and graham crackers. Pt engaged in self-feeding with good rate and appropriate bolus/bite size. Pt is educated on aspiration precautions. At this time, recommend pt return to regular diet with thin liquids, medicine whole with thin liquids and strict adherence to aspiration precautions. Skilled ST services no longer appear indicated.    HPI HPI: Per Physician Progress Note Geoffrey Walker is a 64 y.o. male with medical history significant of lung cancer status post pneumonectomy, COPD, chronic hypoxemic respiratory failure in the setting of COPD on 2 L of oxygen via nasal cannula, hypertension, chronic venous stasis, tobacco dependence presented with worsening shortness of breath since 1 week Choking event 1/1- initially on bipap. CTA was negative for PE. Suspicious to me for aspiration PNA vs HAP.      SLP Plan  All goals met        Swallow Evaluation Recommendations   Recommendations: PO diet PO Diet Recommendation: Regular;Thin liquids (Level 0) Liquid Administration via: Straw Medication Administration: Whole meds with liquid Supervision: Patient able to self-feed Postural changes: Position pt fully upright for meals;Stay upright 30-60 min after meals Oral care recommendations: Oral care BID (2x/day)     Recommendations                      Oral care BID     Dysphagia, unspecified (R13.10)     All goals met    Geoffrey Walker B. Rubbie, M.S., CCC-SLP, Tree Surgeon Certified Brain Injury Specialist [facility]  [facility] Rehabilitation Services Office [phone] Ascom [phone] Fax [phone]

## 2024-11-15 NOTE — Plan of Care (Signed)
  Problem: Clinical Measurements: Goal: Respiratory complications will improve Outcome: Progressing Goal: Cardiovascular complication will be avoided Outcome: Progressing   Problem: Nutrition: Goal: Adequate nutrition will be maintained Outcome: Progressing   Problem: Pain Managment: Goal: General experience of comfort will improve and/or be controlled Outcome: Progressing

## 2024-11-15 NOTE — Progress Notes (Signed)
" ° °  NAME:  Geoffrey Walker, MRN:  [id number], DOB:  1961/04/23, LOS: 8 ADMISSION DATE:  11/07/2024, CONSULTATION DATE:  11/13/2024 REFERRING MD:  Dr. Lenon, CHIEF COMPLAINT:  Acute hypoxic respiratory failure   History of Present Illness:  This is a case of a 64 year old male patient with a past medical history of lung cancer status post right pneumonectomy at [facility], COPD, hypertension, hyperlipidemia presenting to [facility] 6 days ago COPD exacerbation secondary to influenza A.  He was improving and was moving to SNF however on 01/01 had a choking episode and possibly aspirated leading to worsening respiratory status and increased oxygen requirement.  Pulmonary team has been consulted for help with further management.   CTA chest obtained was negative for PE and showed multiple small consolidative opacities throughout the left lung.  No signs of overt consolidation nor pulmonary edema.   Started on cefepime  by the primary team.  Reinitiated Solu-Medrol  20 mg IV daily.  Interim History / Subjective:  -- Still states that he does not feel better. With main complaint 'Unable to breath'.  -- Respiratory status is stable and does not appear in distress.  -- On 4L [place] saturating 100%.  -- Chest auscultation significantly imprved.  -- CXR without focal opacity in the left lung.  -- Sputum culture not sent.   Objective    Blood pressure 137/83, pulse 67, temperature 97.6 F (36.4 C), temperature source Oral, resp. rate 11, height 5' 10 (1.778 m), weight 91 kg, SpO2 97%.        Intake/Output Summary (Last 24 hours) at 11/15/2024 0916 Last data filed at 11/15/2024 0600 Gross per 24 hour  Intake 1032.67 ml  Output 850 ml  Net 182.67 ml   Filed Weights   11/07/24 1507 11/13/24 1015  Weight: 85.8 kg 91 kg    Examination: GEN acutely ill but follows commands HEENT supple neck, reactive pupils CVS normal S1, normal S2, no murmurs or extra sounds appreciated Lungs Imrpoved air entry across the left  hemithorax. 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  Was improving but had an aspiration event on 01/01 with worsening respiratory status requiring increased oxygen support.  Patient was previously evaluated for dysphagia at OSH. MBSS at OSH on 09/2023 was normal. EGD 09/2023 was normal.    If concern of CVA given some mild notable weakness on the right side and aspiration event, MRI brain can be considered.    I agree with ongoing excellent management per the primary team including  antibiotics and IV Solu-Medrol  20 mg daily. Can complete a 5 day course of both.  Continuing with DuoNebs every 8 hours.   C/w aggressive chest PT with Mucomyst  preceded by albuterol  and bed vibration every 8 hours to help clear his secretions.    I personally spent a total of 50 minutes in the care of the patient today including preparing to see the patient, getting/reviewing separately obtained history, performing a medically appropriate exam/evaluation, counseling and educating, documenting clinical information in the EHR, independently interpreting results, and communicating results.   Darrin Barn, MD [facility] 11/15/2024 9:30 AM   "

## 2024-11-15 NOTE — TOC Progression Note (Signed)
 Transition of Care [facility]) - Progression Note    Patient Details  Name: Geoffrey Walker MRN: [id number] Date of Birth: 06/09/1961  [facility]) CM/SW Contact  Victory Jackquline RAMAN, RN Phone Number: 11/15/2024, 5:51 PM  Clinical Narrative:     RNCM met with patient at bedside. RNCM introduced myself and my role and explained that discharge planning would be discussed. PT is recommending STR, patient is in agreement with STR and would like for me to submit referral's for him because he's not familiar with any SNF's in the area just would ike to be close to home. Referral's submitted, FL2 completed and PASRR# [id number]. RNCM will review bed offers with patient once received. RNCM will continue to follow for discharge planning needs.                     Expected Discharge Plan and Services                                               Social Drivers of Health (SDOH) Interventions SDOH Screenings   Food Insecurity: No Food Insecurity (11/09/2024)  Housing: Low Risk (11/09/2024)  Transportation Needs: No Transportation Needs (11/09/2024)  Utilities: Not At Risk (11/09/2024)  Financial Resource Strain: Low Risk (09/21/2023)   Received from [facility]  Tobacco Use: High Risk (11/07/2024)    Readmission Risk Interventions     No data to display

## 2024-11-15 NOTE — Plan of Care (Signed)
   Problem: Fluid Volume: Goal: Hemodynamic stability will improve Outcome: Progressing   Problem: Clinical Measurements: Goal: Diagnostic test results will improve Outcome: Progressing Goal: Signs and symptoms of infection will decrease Outcome: Progressing   Problem: Respiratory: Goal: Ability to maintain adequate ventilation will improve Outcome: Progressing

## 2024-11-15 NOTE — Progress Notes (Signed)
 " PROGRESS NOTE Geoffrey Walker  FMW:[id number] DOB: Jan 22, 1961 DOA: 11/07/2024 PCP: [facility]  Chief Complaint  Patient presents with   Shortness of Breath   Hospital Course:  Geoffrey Walker is a 64 y.o. male with medical history significant of lung cancer status post L pneumonectomy, COPD, chronic hypoxemic respiratory failure in the setting of COPD on 2 L at baseline, hypertension, chronic venous stasis, tobacco dependence presented with worsening shortness of breath since 1 week.  Workup revealed influenza positive. He had some initial progress for improvement and was moving toward dc to SNF.  However, had choking event evening of 1/1 that may have resulted in aspiration.  His O2 status which was stable on 5L progressing to needing bipap at 45% and feeling unwell. CTA was negative for PE. Suspicious to me for aspiration PNA vs HAP.  Started on cefepime . Transferred to stepdown and consulted pulmonology 1/2.  SLP evaluated and he has been placed back on full diet today.   Subjective: Patient resting this am and appears comfortable on 2L. O2 sats >96% throughout our encounter and no cough or increased WOB with speaking. He ruminates on I can't breath.  Objective: Blood pressure (!) 140/82, pulse 69, temperature 97.6 F (36.4 C), temperature source Oral, resp. rate 18, height 5' 10 (1.778 m), weight 85.8 kg, SpO2 100%.  Examination: Constitutional: resting on 2L. Appears comfortable Respiratory: no wheezing. Course breath sounds on L. normal WOB. No accessory muscle use Cardiovascular: Regular rate and rhythm, no murmurs / rubs / gallops. No extremity edema.  Musculoskeletal: clubbing. No cyanosis. No joint deformity upper and lower extremities. Good ROM, no contractures. Normal muscle tone.  Skin: no rashes, lesions, ulcers. No induration Psychiatric: anxious  Assessment & Plan:  Acute on chronic hypoxemic respiratory failure in the setting of influenza A H/o lung  cancer s/p R pneumonectomy Baseline 2-5 L of oxygen via nasal cannula. He states that he uses 5L with his concentrator at home.  Chest x-ray negative for pneumonia on admission and on repeat. Had no desats and was working toward dc to SNF until had choking episode on a pill 1/1. Has had progressively worsening dyspnea and hypoxia since then.  CTA 1/2 negative for PE. Shows stable post-op R side and scattered opacities on the left lung. Considerable emphysema. Out of window for Tamiflu  - continue Duonebs q6 - continue cefepime  for concern of aspiration vs HAP. MRSA swab neg - Incentive spirometer and flutter valve - wean O2 as tolerated- now on baseline 2L - Hydroxyzine  for anxiety PRN, or ativan  - Morphine  PRN for air hunger - pulmonology consulted and moved to stepdown. Appreciate your input.   - aggressive chest physiotherapy  - SLP eval  - now moved to regular diet - got brain MRI to rule out occult stroke given the sharp decline in his ability to swallow which was negative for acute pathology - may consider diuretics  Elevated troponin Elevated BNP - Troponin flat.  Likely demand ischemia in the setting of sepsis. Downtrended    Hypokalemia:  Hypophosphatemia Repleted  Hypertension: On Coreg , will hold off hydrochlorothiazide Temporarily on IV metoprolol  while on bipap and not able to take PO tablets  Hyperlipidemia: Continue statin   Tobacco abuse: - Reports he smokes 1 to 2 cigarettes/day.  Recommend cessation  DVT prophylaxis: Lovenox  SQ   Code Status: Full Code Disposition:  Home  Consultants:  pulmonology   Procedures:  None  Antimicrobials:  Anti-infectives (From admission, onward)    Start  Dose/Rate Route Frequency Ordered Stop   11/13/24 1430  ceFEPIme  (MAXIPIME ) 2 g in sodium chloride  0.9 % 100 mL IVPB        2 g 200 mL/hr over 30 Minutes Intravenous Every 8 hours 11/13/24 1342 11/18/24 1429   11/13/24 1130  oseltamivir  (TAMIFLU ) capsule 75 mg   Status:  Discontinued        75 mg Oral 2 times daily 11/13/24 1037 11/13/24 1437   11/07/24 1515  vancomycin  (VANCOCIN ) IVPB 1000 mg/200 mL premix        1,000 mg 200 mL/hr over 60 Minutes Intravenous  Once 11/07/24 1511 11/07/24 1702   11/07/24 1515  ceFEPIme  (MAXIPIME ) 2 g in sodium chloride  0.9 % 100 mL IVPB        2 g 200 mL/hr over 30 Minutes Intravenous  Once 11/07/24 1511 11/07/24 1550   11/07/24 1515  doxycycline  (VIBRA -TABS) tablet 100 mg        100 mg Oral  Once 11/07/24 1511 11/07/24 1555      Data Reviewed: I have personally reviewed following labs and imaging studies CBC: Recent Labs  Lab 11/09/24 0324 11/11/24 0436 11/14/24 0354 11/15/24 0342  WBC 4.9 4.5 4.6 10.6*  HGB 10.4* 10.5* 11.7* 11.1*  HCT 32.1* 32.2* 35.0* 32.5*  MCV 90.7 88.7 87.3 86.9  PLT 234 218 184 154   Basic Metabolic Panel: Recent Labs  Lab 11/09/24 0324 11/10/24 0425 11/11/24 0436 11/14/24 0354 11/14/24 2023 11/15/24 0342  NA 137 134* 138 137 141  --   K 4.2 4.6 4.8 4.5 4.6  --   CL 99 97* 100 94* 97*  --   CO2 30 33* 35* 37* 37*  --   GLUCOSE 177* 155* 123* 150* 122*  --   BUN 20 22 24* 33* 35*  --   CREATININE 0.85 0.94 0.84 0.85 0.80  --   CALCIUM  8.5* 8.4* 8.7* 8.5* 8.5*  --   MG 1.8 1.9  --  2.1  --  2.2  PHOS 2.9 3.7  --  4.1  --  3.1    LOS: 8 days  MDM: Patient is high risk for one or more organ failure.  They necessitate ongoing hospitalization for continued IV therapies and subsequent lab monitoring. Total time spent interpreting labs and vitals, reviewing the medical record, coordinating care amongst consultants and care team members, directly assessing and discussing care with the patient and/or family: 45 min  Marien LITTIE Piety, MD [facility]  To contact the attending physician between 7A-7P please use Epic Chat. To contact the covering physician during after hours 7P-7A, please review Amion.  11/15/2024, 8:10 AM  "

## 2024-11-16 DIAGNOSIS — J9621 Acute and chronic respiratory failure with hypoxia: Secondary | ICD-10-CM | POA: Diagnosis not present

## 2024-11-16 LAB — GLUCOSE, CAPILLARY
Glucose-Capillary: 120 mg/dL — ABNORMAL HIGH (ref 70–99)
Glucose-Capillary: 115 mg/dL — ABNORMAL HIGH (ref 70–99)
Glucose-Capillary: 122 mg/dL — ABNORMAL HIGH (ref 70–99)

## 2024-11-16 MED ORDER — PANTOPRAZOLE SODIUM 20 MG PO TBEC
20.0000 mg | DELAYED_RELEASE_TABLET | Freq: Every day | ORAL | Status: DC
  Filled 2024-11-16: qty 1

## 2024-11-16 NOTE — Plan of Care (Signed)
  Problem: Clinical Measurements: Goal: Cardiovascular complication will be avoided Outcome: Progressing   Problem: Elimination: Goal: Will not experience complications related to urinary retention Outcome: Progressing   Problem: Pain Managment: Goal: General experience of comfort will improve and/or be controlled Outcome: Progressing   Problem: Safety: Goal: Ability to remain free from injury will improve Outcome: Progressing

## 2024-11-16 NOTE — Progress Notes (Signed)
 OT Cancellation Note  Patient Details Name: Geoffrey Walker MRN: [id number] DOB: 04/04/1961   Cancelled Treatment:    Reason Eval/Treat Not Completed: Other (comment)Patient transferred to higher level of care and will need new OT order when appropriate.   Izetta Claude, MS, OTR/L , CBIS ascom [phone]  11/16/2024, 9:10 AM

## 2024-11-16 NOTE — TOC Progression Note (Signed)
 Transition of Care [facility]) - Progression Note    Patient Details  Name: Geoffrey Walker MRN: [id number] Date of Birth: 04-Jan-1961  [facility]) CM/SW Contact  [place] DELENA Daring, RN Phone Number: 11/16/2024, 4:05 PM  Clinical Narrative:    Met with patient. Advised of SNF bed offer from [facility] in [place]. Patient accepted bed offer. Linked in hub and Photographer. [facility] is getting authorization.                         Expected Discharge Plan and Services                                               Social Drivers of Health (SDOH) Interventions SDOH Screenings   Food Insecurity: No Food Insecurity (11/09/2024)  Housing: Low Risk (11/09/2024)  Transportation Needs: No Transportation Needs (11/09/2024)  Utilities: Not At Risk (11/09/2024)  Financial Resource Strain: Low Risk (09/21/2023)   Received from [facility]  Tobacco Use: High Risk (11/07/2024)    Readmission Risk Interventions     No data to display

## 2024-11-16 NOTE — Plan of Care (Signed)

## 2024-11-16 NOTE — Progress Notes (Signed)
 " PROGRESS NOTE Geoffrey Walker  FMW:[id number] DOB: 09-Aug-1961 DOA: 11/07/2024 PCP: [facility]  Chief Complaint  Patient presents with   Shortness of Breath   Hospital Course:  Geoffrey Walker is a 64 y.o. male with medical history significant of lung cancer status post L pneumonectomy, COPD, chronic hypoxemic respiratory failure in the setting of COPD on 2 L at baseline, hypertension, chronic venous stasis, tobacco dependence presented with worsening shortness of breath since 1 week.  Workup revealed influenza positive. He had some initial progress for improvement and was moving toward dc to SNF.  However, had choking event evening of 1/1 that may have resulted in aspiration.  His O2 status which was stable on 5L progressing to needing bipap at 45% and feeling unwell. CTA was negative for PE. Suspicious to me for aspiration PNA vs HAP.  Started on cefepime . Transferred to stepdown and consulted pulmonology 1/2.  SLP evaluated and he has been placed back on full diet.  Subjective: Patient reports doing well this am. Denies SOB at rest.   Objective: Blood pressure (!) 140/82, pulse 69, temperature 97.6 F (36.4 C), temperature source Oral, resp. rate 18, height 5' 10 (1.778 m), weight 85.8 kg, SpO2 100%.  Examination: Constitutional: resting on 2L. Appears comfortable Respiratory: no wheezing. Course breath sounds on L. normal WOB. No accessory muscle use Cardiovascular: Regular rate and rhythm, no murmurs / rubs / gallops. No extremity edema.  Musculoskeletal: clubbing. No cyanosis. No joint deformity upper and lower extremities. Good ROM, no contractures. Normal muscle tone.  Skin: no rashes, lesions, ulcers. No induration Psychiatric: flat mood  Assessment & Plan:  Acute on chronic hypoxemic respiratory failure in the setting of influenza A H/o lung cancer s/p R pneumonectomy Baseline 2-5 L of oxygen via nasal cannula. He states that he uses 5L with his concentrator at  home.  Chest x-ray negative for pneumonia on admission and on repeat. Had no desats and was working toward dc to SNF until had choking episode on a pill 1/1. Has had progressively worsening dyspnea and hypoxia since then.  CTA 1/2 negative for PE. Shows stable post-op R side and scattered opacities on the left lung. Considerable emphysema. Out of window for Tamiflu  - continue Duonebs q6 - continue cefepime  for concern of aspiration vs HAP. MRSA swab neg - Incentive spirometer and flutter valve - wean O2 as tolerated- now on baseline 2L - Hydroxyzine  for anxiety PRN, or ativan  - Morphine  PRN for air hunger - pulmonology consulted and moved to stepdown. Appreciate your input.   - aggressive chest physiotherapy  - SLP eval  - now moved to regular diet - got brain MRI to rule out occult stroke given the sharp decline in his ability to swallow which was negative for acute pathology - may consider diuretics  Elevated troponin Elevated BNP - Troponin flat.  Likely demand ischemia in the setting of sepsis. Downtrended    Hypokalemia:  Hypophosphatemia Repleted  Hypertension: On Coreg , will hold off hydrochlorothiazide Temporarily on IV metoprolol  while on bipap and not able to take PO tablets  Hyperlipidemia: Continue statin   Tobacco abuse: - Reports he smokes 1 to 2 cigarettes/day.  Recommend cessation  DVT prophylaxis: Lovenox  SQ   Code Status: Full Code Disposition:  Home  Consultants:  pulmonology    Procedures:  None  Antimicrobials:  Anti-infectives (From admission, onward)    Start     Dose/Rate Route Frequency Ordered Stop   11/13/24 1430  ceFEPIme  (MAXIPIME ) 2 g in  sodium chloride  0.9 % 100 mL IVPB        2 g 200 mL/hr over 30 Minutes Intravenous Every 8 hours 11/13/24 1342 11/18/24 1429   11/13/24 1130  oseltamivir  (TAMIFLU ) capsule 75 mg  Status:  Discontinued        75 mg Oral 2 times daily 11/13/24 1037 11/13/24 1437   11/07/24 1515  vancomycin  (VANCOCIN )  IVPB 1000 mg/200 mL premix        1,000 mg 200 mL/hr over 60 Minutes Intravenous  Once 11/07/24 1511 11/07/24 1702   11/07/24 1515  ceFEPIme  (MAXIPIME ) 2 g in sodium chloride  0.9 % 100 mL IVPB        2 g 200 mL/hr over 30 Minutes Intravenous  Once 11/07/24 1511 11/07/24 1550   11/07/24 1515  doxycycline  (VIBRA -TABS) tablet 100 mg        100 mg Oral  Once 11/07/24 1511 11/07/24 1555      Data Reviewed: I have personally reviewed following labs and imaging studies CBC: Recent Labs  Lab 11/11/24 0436 11/14/24 0354 11/15/24 0342  WBC 4.5 4.6 10.6*  HGB 10.5* 11.7* 11.1*  HCT 32.2* 35.0* 32.5*  MCV 88.7 87.3 86.9  PLT 218 184 154   Basic Metabolic Panel: Recent Labs  Lab 11/10/24 0425 11/11/24 0436 11/14/24 0354 11/14/24 2023 11/15/24 0342  NA 134* 138 137 141  --   K 4.6 4.8 4.5 4.6  --   CL 97* 100 94* 97*  --   CO2 33* 35* 37* 37*  --   GLUCOSE 155* 123* 150* 122*  --   BUN 22 24* 33* 35*  --   CREATININE 0.94 0.84 0.85 0.80  --   CALCIUM  8.4* 8.7* 8.5* 8.5*  --   MG 1.9  --  2.1  --  2.2  PHOS 3.7  --  4.1  --  3.1    LOS: 9 days  MDM: Patient is high risk for one or more organ failure.  They necessitate ongoing hospitalization for continued IV therapies and subsequent lab monitoring. Total time spent interpreting labs and vitals, reviewing the medical record, coordinating care amongst consultants and care team members, directly assessing and discussing care with the patient and/or family: 45 min  Geoffrey LITTIE Piety, MD [facility]  To contact the attending physician between 7A-7P please use Epic Chat. To contact the covering physician during after hours 7P-7A, please review Amion.  11/16/2024, 7:37 AM  "

## 2024-11-17 DIAGNOSIS — J9621 Acute and chronic respiratory failure with hypoxia: Secondary | ICD-10-CM | POA: Diagnosis not present

## 2024-11-17 MED ORDER — BUDESONIDE 0.5 MG/2ML IN SUSP
0.5000 mg | Freq: Two times a day (BID) | RESPIRATORY_TRACT | Status: DC
  Administered 2024-11-17 – 2024-11-27 (×19): 0.5 mg via RESPIRATORY_TRACT
  Filled 2024-11-17 (×19): qty 2

## 2024-11-17 MED ORDER — IPRATROPIUM-ALBUTEROL 0.5-2.5 (3) MG/3ML IN SOLN
3.0000 mL | RESPIRATORY_TRACT | Status: DC
  Administered 2024-11-17 – 2024-11-18 (×6): 3 mL via RESPIRATORY_TRACT
  Filled 2024-11-17 (×7): qty 3

## 2024-11-17 MED ORDER — PREDNISONE 20 MG PO TABS
20.0000 mg | ORAL_TABLET | Freq: Every day | ORAL | Status: AC
  Administered 2024-11-17 – 2024-11-18 (×2): 20 mg via ORAL
  Filled 2024-11-17 (×2): qty 1

## 2024-11-17 NOTE — Plan of Care (Signed)

## 2024-11-17 NOTE — Plan of Care (Signed)
 Admitted for COPD exacerbation due to INFLUENZA A Pneumonia Patient with end stage lung disease Patient REFUSED CHEST PT  Continue oxygen Continue NEB therapy Continue systemic steroids    Will sign off at this time. No further recommendations at this time.   Nickolas Alm Cellar, M.D.  [facility]  Medical Director [facility] Medical Director [facility] Cardio-Pulmonary Department

## 2024-11-17 NOTE — Plan of Care (Signed)
   Problem: Clinical Measurements: Goal: Cardiovascular complication will be avoided Outcome: Progressing   Problem: Pain Managment: Goal: General experience of comfort will improve and/or be controlled Outcome: Progressing

## 2024-11-17 NOTE — TOC Progression Note (Signed)
 Transition of Care [facility]) - Progression Note    Patient Details  Name: Arvel Oquinn MRN: [id number] Date of Birth: 04/06/1961  [facility]) CM/SW Contact  Lauraine JAYSON Carpen, LCSW Phone Number: 11/17/2024, 12:27 PM  Clinical Narrative:   Left message for [facility] admissions coordinator to check auth status.  Expected Discharge Plan and Services                                               Social Drivers of Health (SDOH) Interventions SDOH Screenings   Food Insecurity: No Food Insecurity (11/09/2024)  Housing: Low Risk (11/09/2024)  Transportation Needs: No Transportation Needs (11/09/2024)  Utilities: Not At Risk (11/09/2024)  Financial Resource Strain: Low Risk (09/21/2023)   Received from [facility]  Tobacco Use: High Risk (11/07/2024)    Readmission Risk Interventions     No data to display

## 2024-11-17 NOTE — Progress Notes (Signed)
 Pt refused chest PT. Chest vest remains at bedside

## 2024-11-17 NOTE — Progress Notes (Signed)
 " PROGRESS NOTE Geoffrey Walker  FMW:[id number] DOB: 29-Nov-1960 DOA: 11/07/2024 PCP: [facility]  Chief Complaint  Patient presents with   Shortness of Breath   Hospital Course:  Geoffrey Walker is a 64 y.o. male with medical history significant of lung cancer status post L pneumonectomy, COPD, chronic hypoxemic respiratory failure in the setting of COPD on 2 L at baseline, hypertension, chronic venous stasis, tobacco dependence presented with worsening shortness of breath since 1 week.  Workup revealed influenza positive. He had some initial progress for improvement and was moving toward dc to SNF.  However, had choking event evening of 1/1 that may have resulted in aspiration.  His O2 status which was stable on 5L progressing to needing bipap at 45% and feeling unwell. CTA was negative for PE. Suspicious to me for aspiration PNA vs HAP.  Started on cefepime . Transferred to stepdown and consulted pulmonology 1/2.  SLP evaluated and he has been placed back on full diet. He is now stable on/around his home O2 level with no desats and continues to look comfortable. He is medically stable for dc to SNF when available.  Of note, patient continues to complain that he cannot breath. Refuses chest PT. Found to be smoking in room today.   Subjective: Patient reports that he can't breath.   Objective: Blood pressure (!) 140/82, pulse 69, temperature 97.6 F (36.4 C), temperature source Oral, resp. rate 18, height 5' 10 (1.778 m), weight 85.8 kg, SpO2 100%.  Examination: Constitutional: resting on 3L. Appears comfortable Respiratory: no wheezing. Normal breath sounds on L. normal WOB. No accessory muscle use. Able to speak normally without dyspnea.  Cardiovascular: Regular rate and rhythm, no murmurs / rubs / gallops. No extremity edema.  Musculoskeletal: clubbing. No cyanosis. No joint deformity upper and lower extremities. Good ROM, no contractures. Normal muscle tone.  Skin: no  rashes, lesions, ulcers. No induration Psychiatric: flat mood  Assessment & Plan:  Acute on chronic hypoxemic respiratory failure in the setting of influenza A H/o lung cancer s/p R pneumonectomy Baseline 2-5 L of oxygen via nasal cannula. He states that he uses 5L with his concentrator at home.  Chest x-ray negative for pneumonia on admission and on repeat. Had no desats and was working toward dc to SNF until had choking episode on a pill 1/1 leading to progressively worsening dyspnea and hypoxia.  CTA 1/2 negative for PE. Shows stable post-op R side and scattered opacities on the left lung. Considerable emphysema. Out of window for Tamiflu  - continue Duonebs q6 - continue cefepime  for concern of aspiration vs HAP. MRSA swab neg - Incentive spirometer and flutter valve - wean O2 as tolerated- now on baseline - Hydroxyzine  for anxiety PRN, or ativan  - Morphine  PRN for air hunger - pulmonology consulted and moved to stepdown. Appreciate your input.   - aggressive chest physiotherapy  - SLP eval  - now moved to regular diet - got brain MRI to rule out occult stroke given the sharp decline in his ability to swallow which was negative for acute pathology - may consider diuretics - counsel on smoking cessation  Elevated troponin Elevated BNP - Troponin flat.  Likely demand ischemia in the setting of sepsis. Downtrended    Hypokalemia:  Hypophosphatemia Repleted  Hypertension: On Coreg , will hold off hydrochlorothiazide Temporarily was on IV metoprolol  while on bipap and not able to take PO tablets  Hyperlipidemia: Continue statin   Tobacco abuse: - Reports he smokes 1 to 2 cigarettes/day.  Recommend  cessation  DVT prophylaxis: Lovenox  SQ   Code Status: Full Code Disposition:  Home  Consultants:  pulmonology    Procedures:  None  Antimicrobials:  Anti-infectives (From admission, onward)    Start     Dose/Rate Route Frequency Ordered Stop   11/13/24 1430  ceFEPIme   (MAXIPIME ) 2 g in sodium chloride  0.9 % 100 mL IVPB        2 g 200 mL/hr over 30 Minutes Intravenous Every 8 hours 11/13/24 1342 11/18/24 1429   11/13/24 1130  oseltamivir  (TAMIFLU ) capsule 75 mg  Status:  Discontinued        75 mg Oral 2 times daily 11/13/24 1037 11/13/24 1437   11/07/24 1515  vancomycin  (VANCOCIN ) IVPB 1000 mg/200 mL premix        1,000 mg 200 mL/hr over 60 Minutes Intravenous  Once 11/07/24 1511 11/07/24 1702   11/07/24 1515  ceFEPIme  (MAXIPIME ) 2 g in sodium chloride  0.9 % 100 mL IVPB        2 g 200 mL/hr over 30 Minutes Intravenous  Once 11/07/24 1511 11/07/24 1550   11/07/24 1515  doxycycline  (VIBRA -TABS) tablet 100 mg        100 mg Oral  Once 11/07/24 1511 11/07/24 1555      Data Reviewed: I have personally reviewed following labs and imaging studies CBC: Recent Labs  Lab 11/11/24 0436 11/14/24 0354 11/15/24 0342  WBC 4.5 4.6 10.6*  HGB 10.5* 11.7* 11.1*  HCT 32.2* 35.0* 32.5*  MCV 88.7 87.3 86.9  PLT 218 184 154   Basic Metabolic Panel: Recent Labs  Lab 11/11/24 0436 11/14/24 0354 11/14/24 2023 11/15/24 0342  NA 138 137 141  --   K 4.8 4.5 4.6  --   CL 100 94* 97*  --   CO2 35* 37* 37*  --   GLUCOSE 123* 150* 122*  --   BUN 24* 33* 35*  --   CREATININE 0.84 0.85 0.80  --   CALCIUM  8.7* 8.5* 8.5*  --   MG  --  2.1  --  2.2  PHOS  --  4.1  --  3.1    LOS: 10 days  MDM: Patient is high risk for one or more organ failure.  They necessitate ongoing hospitalization for continued IV therapies and subsequent lab monitoring. Total time spent interpreting labs and vitals, reviewing the medical record, coordinating care amongst consultants and care team members, directly assessing and discussing care with the patient and/or family: 45 min  Marien LITTIE Piety, MD [facility]  To contact the attending physician between 7A-7P please use Epic Chat. To contact the covering physician during after hours 7P-7A, please review Amion.  11/17/2024, 7:35 AM   "

## 2024-11-18 DIAGNOSIS — Z720 Tobacco use: Secondary | ICD-10-CM | POA: Diagnosis not present

## 2024-11-18 DIAGNOSIS — A419 Sepsis, unspecified organism: Secondary | ICD-10-CM | POA: Diagnosis not present

## 2024-11-18 DIAGNOSIS — J9601 Acute respiratory failure with hypoxia: Secondary | ICD-10-CM | POA: Diagnosis not present

## 2024-11-18 DIAGNOSIS — E876 Hypokalemia: Secondary | ICD-10-CM

## 2024-11-18 DIAGNOSIS — J09X1 Influenza due to identified novel influenza A virus with pneumonia: Secondary | ICD-10-CM

## 2024-11-18 DIAGNOSIS — R7989 Other specified abnormal findings of blood chemistry: Secondary | ICD-10-CM | POA: Diagnosis not present

## 2024-11-18 DIAGNOSIS — J9621 Acute and chronic respiratory failure with hypoxia: Secondary | ICD-10-CM | POA: Diagnosis not present

## 2024-11-18 MED ORDER — FAMOTIDINE 20 MG PO TABS
20.0000 mg | ORAL_TABLET | Freq: Every day | ORAL | Status: DC
  Administered 2024-11-18 – 2024-11-27 (×10): 20 mg via ORAL
  Filled 2024-11-18 (×9): qty 1

## 2024-11-18 MED ORDER — IPRATROPIUM-ALBUTEROL 0.5-2.5 (3) MG/3ML IN SOLN
3.0000 mL | Freq: Four times a day (QID) | RESPIRATORY_TRACT | Status: DC
  Administered 2024-11-19: 3 mL via RESPIRATORY_TRACT
  Filled 2024-11-18 (×2): qty 3

## 2024-11-18 NOTE — Progress Notes (Signed)
 " Progress Note   Patient: Geoffrey Walker FMW:[id number] DOB: 23-Jan-1961 DOA: 11/07/2024  DOS: the patient was seen and examined on 11/18/2024   Brief hospital course:  64 y.o. male with medical history significant of lung cancer status post L pneumonectomy, COPD, chronic hypoxemic respiratory failure in the setting of COPD on 2 L at baseline, hypertension, chronic venous stasis, tobacco dependence presented with worsening shortness of breath since 1 week.   Assessment and Plan:  Concern for sepsis (POA) - Lactic acidosis, leukocytosis, worsening hypoxia, influenza A on presentation given concern for sepsis.  Appears to be resolved at this time.  Blood pressure stable.  Acute on chronic hypoxic respiratory failure - Initially requiring BiPAP.  Able to be weaned down to 4 L nasal cannula.  Patient's baseline 2-5 L nasal cannula.  He titrates O2 based on his perceived shortness of breath.  O2 goal 88-92.  Acute influenza A with concern for aspiration versus HAP - Rapid screen positive.  Out of the window for Tamiflu .  Appears to be resolving, back to baseline O2.  Still having purulent cough with sputum however.  Empiric cefepime  on board.  Flutter valve, incentive spirometer.  O2 at baseline.  Chest PT.  Elevated troponin - Minimally elevated and flat suggesting demand ischemia.  Hypertension - Currently on Coreg .  Tobacco abuse - Reports that he smokes 1-2 cigarettes/day.  Recommend cessation.  Hypokalemia/hypophosphatemia - Replenishment on board.  Will recheck BMP in AM.  Physical debilitation muscle weakness - Evaluated by PT/OT.  Recommending SNF.  Working closely with TOC on disposition planning.   Subjective: Patient resting comfortably this morning.  Intermittently short of breath.  Currently on 4 L nasal cannula.  Has a productive cough but otherwise feeling well.  Denies any chest pain, fever, chills, nausea, vomiting.  Physical Exam:  Vitals:   11/18/24 0521 11/18/24  0756 11/18/24 1035 11/18/24 1210  BP:  (!) 155/78  (!) 110/55  Pulse:  64  70  Resp:      Temp:  (!) 97.4 F (36.3 C)  98.6 F (37 C)  TempSrc:      SpO2:  100% 96% 98%  Weight: 96.8 kg     Height:        GENERAL:  Alert, pleasant, no acute distress, disheveled HEENT:  EOMI, nasal cannula CARDIOVASCULAR:  RRR, no murmurs appreciated RESPIRATORY: Productive cough, poor air movement bilaterally GASTROINTESTINAL:  Soft, nontender, nondistended EXTREMITIES:  No LE edema bilaterally NEURO:  No new focal deficits appreciated SKIN:  No rashes noted PSYCH:  Appropriate mood and affect     Data Reviewed:  Imaging Studies: MR BRAIN W WO CONTRAST Result Date: 11/15/2024 EXAM: MRI BRAIN WITH AND WITHOUT CONTRAST 11/15/2024 11:46:43 AM TECHNIQUE: Multiplanar multisequence MRI of the head/brain was performed with and without the administration of 9 mL gadobutrol  (GADAVIST ) 1 MMOL/ML IV SOLN. COMPARISON: None available. CLINICAL HISTORY: Palate weakness (CN 9) and possible aspiration. History of lung cancer. FINDINGS: BRAIN AND VENTRICLES: No acute infarct. No acute intracranial hemorrhage. No mass effect or midline shift. No hydrocephalus. Ventricular and scattered subcortical T2 hyperintensities are moderately advanced for age. Mild generalized atrophy is present. The sella is unremarkable. Normal flow voids. No mass or abnormal enhancement. ORBITS: No acute abnormality. SINUSES: Mild mucosal thickening is present in the inferior maxillary sinuses bilaterally. BONES AND SOFT TISSUES: Normal bone marrow signal and enhancement. Bilateral mastoid effusions are present. IMPRESSION: 1. No acute intracranial abnormality. 2. Moderately advanced ventricular and scattered subcortical T2 hyperintensities for  age. Electronically signed by: Lonni Necessary MD 11/15/2024 12:01 PM EST RP Workstation: [id number]   DG Chest Port 1 View Result Date: 11/15/2024 EXAM: 1 VIEW(S) XRAY OF THE CHEST 11/15/2024  05:09:00 AM COMPARISON: CXR 11/13/2024 and CT angio chest 11/13/2024. CLINICAL HISTORY: Shortness of breath. FINDINGS: LUNGS AND PLEURA: Right pneumonectomy. Right hemithorax opacification with rightward mediastinal shift, unchanged and consistent with postsurgical changes. Surgical clips are present over the right hilum. No focal pulmonary opacity is seen in the left lung. No pleural effusion or pneumothorax is identified in the left hemithorax. HEART AND MEDIASTINUM: The mediastinal silhouette is shifted rightward due to postsurgical changes. BONES AND SOFT TISSUES: Redemonstration of widening of the seventh and eighth ribs in the setting of known intrathoracic right posterolateral chest wall herniation, best seen on CT angio chest 11/13/2024. IMPRESSION: 1. Unchanged right pneumonectomy with right hemithorax opacification and rightward mediastinal shift, consistent with postsurgical changes. 2. Widening of the seventh and eighth ribs in the setting of known intrathoracic right posterolateral chest wall herniation , best seen on CT angio chest 11/13/2024. Electronically signed by: Morgane Naveau MD 11/15/2024 08:19 AM EST RP Workstation: [id number]   ECHOCARDIOGRAM COMPLETE Result Date: 11/13/2024    ECHOCARDIOGRAM REPORT   Patient Name:   Geoffrey Walker Date of Exam: 11/13/2024 Medical Rec #:  [id number]     Height:       70.0 in Accession #:    [id number]    Weight:       200.6 lb Date of Birth:  11/18/1960     BSA:          2.090 m Patient Age:    63 years      BP:           144/100 mmHg Patient Gender: M             HR:           87 bpm. Exam Location:  [facility] Procedure: 2D Echo, Cardiac Doppler and Color Doppler (Both Spectral and Color            Flow Doppler were utilized during procedure). Indications:     Dyspnea R06.00  History:         Patient has no prior history of Echocardiogram examinations.                  Signs/Symptoms:Dyspnea.  Sonographer:     Rosina Dunk Referring Phys:  [id number] Geoffrey Walker Diagnosing Phys: Geoffrey Walker  Sonographer Comments: Technically difficult study due to poor echo windows and no apical window. IMPRESSIONS  1. Poor study quality. No apical views and limited images in other views.  2. Left ventricle incompletely visualized. Normal systolic function, LVEF 55-60% based on limited parasternal views. Wall motion analysis cannot be assessed.  3. Right ventrile not well visualized. Appears dilated with reduced function based on very limited views.  4. The mitral valve was not well visualized. No evidence of mitral valve regurgitation.  5. The aortic valve was not well visualized. Aortic valve regurgitation is not visualized. FINDINGS  Left Ventricle: Left ventricle incompletely visualized. Normal systolic function, LVEF 55-60% based on limited parasternal views. Wall motion analysis cannot be assessed. Right Ventricle: Right ventrile not well visualized. Appears dilated with reduced function based on very limited views. Left Atrium: Left atrial size was not well visualized. Right Atrium: Right atrial size was not well visualized. Pericardium: There is no evidence of pericardial effusion. Mitral Valve: The mitral valve was not  well visualized. No evidence of mitral valve regurgitation. Tricuspid Valve: The tricuspid valve is not well visualized. Tricuspid valve regurgitation is trivial. Aortic Valve: The aortic valve was not well visualized. Aortic valve regurgitation is not visualized. Pulmonic Valve: The pulmonic valve was not well visualized. Pulmonic valve regurgitation is not visualized. Aorta: The aortic root is normal in size and structure. IAS/Shunts: The interatrial septum was not well visualized.  LEFT VENTRICLE PLAX 2D LVIDd:         4.00 cm LVIDs:         3.10 cm LV PW:         1.10 cm LV IVS:        1.50 cm LVOT diam:     2.20 cm LVOT Area:     3.80 cm  IVC IVC diam: 1.80 cm LEFT ATRIUM         Index LA diam:    3.80 cm 1.82 cm/m                        PULMONIC  VALVE AORTA                 PV Vmax:        0.63 m/s Ao Root diam: 3.20 cm PV Vmean:       43.600 cm/s                       PV VTI:         0.096 m                       PV Peak grad:   1.6 mmHg                       PV Mean grad:   1.0 mmHg                       RVOT Peak grad: 1 mmHg   SHUNTS Systemic Diam: 2.20 cm Pulmonic VTI:  0.095 m Geoffrey Walker Electronically signed by Geoffrey Walker Signature Date/Time: 11/13/2024/5:27:57 PM    Final    CT Angio Chest Pulmonary Embolism (PE) W or WO Contrast Result Date: 11/13/2024 EXAM: CTA CHEST 11/13/2024 08:15:00 AM TECHNIQUE: CTA of the chest was performed without and with the administration of 75 mL of iohexol  (OMNIPAQUE ) 350 MG/ML injection. Multiplanar reformatted images are provided for review. MIP images are provided for review. Automated exposure control, iterative reconstruction, and/or weight based adjustment of the mA/kV was utilized to reduce the radiation dose to as low as reasonably achievable. COMPARISON: None available. CLINICAL HISTORY: Pulmonary embolism (PE) suspected, high prob. FINDINGS: PULMONARY ARTERIES: Pulmonary arteries are adequately opacified for evaluation. No acute pulmonary embolus. Main pulmonary artery is normal in caliber. MEDIASTINUM: The heart and mediastinal structures are shifted to the right. The heart and pericardium demonstrate no acute abnormality. There is no acute abnormality of the thoracic aorta. LYMPH NODES: No mediastinal, hilar or axillary lymphadenopathy. LUNGS AND PLEURA: The patient is status post right pneumonectomy. There are numerous irregular patchy reticular opacities present within the periphery of the left lung. There is mild central lobular emphysema. No evidence of pleural effusion or pneumothorax. UPPER ABDOMEN: Limited images of the upper abdomen demonstrate stones independently within the gallbladder. 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 No acute soft tissue abnormality. IMPRESSION: 1. No evidence of pulmonary embolus. 2. 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 3. Numerous irregular patchy reticular opacities in the periphery of the left lung. 4. Mild central lobular emphysema. Pulmonary emphysema is an independent risk factor for lung cancer. Recommend consideration for evaluation for a low-dose CT lung cancer screening program. 5. Cholelithiasis. Electronically signed by: Evalene Coho MD 11/13/2024 08:39 AM EST RP Workstation: [id number]   DG Chest Port 1 View Result Date: 11/13/2024 EXAM: 1 VIEW(S) XRAY OF THE CHEST 11/13/2024 02:37:50 AM COMPARISON: 11/12/2024 CLINICAL HISTORY: Shortness of breath FINDINGS: LUNGS AND PLEURA: Right pneumonectomy. Complete opacification of right hemithorax. HEART AND MEDIASTINUM: Rightward mediastinal shift, unchanged. BONES AND SOFT TISSUES: No acute osseous abnormality. IMPRESSION: 1. Right pneumonectomy with complete opacification of the right hemithorax. 2. No acute findings. Electronically signed by: Dorethia Molt MD 11/13/2024 03:36 AM EST RP Workstation: [id number]   DG Chest Port 1 View Result Date: 11/12/2024 CLINICAL DATA:  Dyspnea. EXAM: PORTABLE CHEST 1 VIEW COMPARISON:  11/07/2024 FINDINGS: Complete opacification of the right hemithorax with rightward mediastinal shift, unchanged from prior exam. Peribronchial thickening in the left lung with subsegmental scarring in the periphery of the midlung zone. No confluent opacity. No pneumothorax or significant left pleural effusion. IMPRESSION: 1. Complete opacification of the right hemithorax with  rightward mediastinal shift, consistent with prior pneumonectomy. 2. Peribronchial thickening in the left lung with subsegmental scarring in the periphery of the midlung zone. Electronically Signed   By: Andrea Gasman M.D.   On: 11/12/2024 18:50   DG Chest Port 1 View Result Date: 11/07/2024 CLINICAL DATA:  Shortness of breath. Status post RIGHT pneumonectomy 5 years ago. EXAM: PORTABLE CHEST - 1 VIEW COMPARISON:  02/04/2016 FINDINGS: Complete opacification of the RIGHT hemithorax with RIGHT mediastinal shift consistent with provided history of a total pneumonectomy. Mild scattered atelectasis of the LEFT lung without focal airspace opacity indicative pneumonia. IMPRESSION: 1. Complete opacification of the RIGHT hemithorax consistent with provided history of total pneumonectomy. 2. No acute cardiopulmonary process. Electronically Signed   By: Aliene Lloyd M.D.   On: 11/07/2024 16:16    There are no new results to review at this time.  Previous records (including but not limited to H&P, progress notes, nursing notes, TOC management) were reviewed in assessment of this patient.  Labs: CBC: Recent Labs  Lab 11/14/24 0354 11/15/24 0342  WBC 4.6 10.6*  HGB 11.7* 11.1*  HCT 35.0* 32.5*  MCV 87.3 86.9  PLT 184 154   Basic Metabolic Panel: Recent Labs  Lab 11/14/24 0354 11/14/24 2023 11/15/24 0342  NA 137 141  --   K 4.5 4.6  --   CL 94* 97*  --   CO2 37* 37*  --   GLUCOSE 150* 122*  --   BUN 33* 35*  --   CREATININE 0.85 0.80  --   CALCIUM  8.5* 8.5*  --   MG 2.1  --  2.2  PHOS 4.1  --  3.1   Liver Function Tests: No results for input(s): AST, ALT, ALKPHOS, BILITOT, PROT, ALBUMIN in the last 168 hours. CBG: Recent Labs  Lab 11/15/24 1203 11/15/24 1802 11/16/24 0929 11/16/24 1240 11/16/24 2027  GLUCAP 142* 123* 120* 115* 122*  Scheduled Meds:  budesonide  (PULMICORT ) nebulizer solution  0.5 mg Nebulization BID   carvedilol   12.5 mg Oral BID WC    Chlorhexidine  Gluconate Cloth  6 each Topical Daily   enoxaparin  (LOVENOX ) injection  40 mg Subcutaneous Q24H   ipratropium-albuterol   3 mL Nebulization Q4H   Continuous Infusions: PRN Meds:.acetaminophen  **OR** acetaminophen , albuterol , hydrOXYzine , LORazepam , menthol , morphine  injection, ondansetron  **OR** ondansetron  (ZOFRAN ) IV, traZODone   Family Communication: None at bedside  Disposition: Status is: Inpatient Remains inpatient appropriate because: Influenza A, dispo     Time spent: 41 minutes  Length of inpatient stay: 11 days  Author: Carliss LELON Canales, DO 11/18/2024 12:26 PM  For on call review [email or web address].   "

## 2024-11-18 NOTE — Plan of Care (Signed)
" °  Problem: Fluid Volume: Goal: Hemodynamic stability will improve Outcome: Not Progressing   Problem: Clinical Measurements: Goal: Diagnostic test results will improve Outcome: Not Progressing Goal: Signs and symptoms of infection will decrease Outcome: Not Progressing   Problem: Respiratory: Goal: Ability to maintain adequate ventilation will improve Outcome: Not Progressing   Problem: Education: Goal: Knowledge of General Education information will improve Description: Including pain rating scale, medication(s)/side effects and non-pharmacologic comfort measures Outcome: Not Progressing   Problem: Health Behavior/Discharge Planning: Goal: Ability to manage health-related needs will improve Outcome: Not Progressing   Problem: Clinical Measurements: Goal: Ability to maintain clinical measurements within normal limits will improve Outcome: Not Progressing Goal: Will remain free from infection Outcome: Not Progressing Goal: Diagnostic test results will improve Outcome: Not Progressing Goal: Respiratory complications will improve Outcome: Not Progressing Goal: Cardiovascular complication will be avoided Outcome: Not Progressing   Problem: Activity: Goal: Risk for activity intolerance will decrease Outcome: Not Progressing   Problem: Nutrition: Goal: Adequate nutrition will be maintained Outcome: Not Progressing   Problem: Coping: Goal: Level of anxiety will decrease Outcome: Not Progressing   Problem: Elimination: Goal: Will not experience complications related to bowel motility Outcome: Not Progressing Goal: Will not experience complications related to urinary retention Outcome: Not Progressing   Problem: Pain Managment: Goal: General experience of comfort will improve and/or be controlled Outcome: Not Progressing   Problem: Safety: Goal: Ability to remain free from injury will improve Outcome: Not Progressing   Problem: Skin Integrity: Goal: Risk for  impaired skin integrity will decrease Outcome: Not Progressing   Problem: Education: Goal: Ability to describe self-care measures that may prevent or decrease complications (Diabetes Survival Skills Education) will improve Outcome: Not Progressing Goal: Individualized Educational Video(s) Outcome: Not Progressing   Problem: Coping: Goal: Ability to adjust to condition or change in health will improve Outcome: Not Progressing   Problem: Fluid Volume: Goal: Ability to maintain a balanced intake and output will improve Outcome: Not Progressing   Problem: Health Behavior/Discharge Planning: Goal: Ability to identify and utilize available resources and services will improve Outcome: Not Progressing Goal: Ability to manage health-related needs will improve Outcome: Not Progressing   Problem: Metabolic: Goal: Ability to maintain appropriate glucose levels will improve Outcome: Not Progressing   Problem: Nutritional: Goal: Maintenance of adequate nutrition will improve Outcome: Not Progressing Goal: Progress toward achieving an optimal weight will improve Outcome: Not Progressing   Problem: Skin Integrity: Goal: Risk for impaired skin integrity will decrease Outcome: Not Progressing   Problem: Tissue Perfusion: Goal: Adequacy of tissue perfusion will improve Outcome: Not Progressing   "

## 2024-11-18 NOTE — TOC Progression Note (Addendum)
 Transition of Care [facility]) - Progression Note    Patient Details  Name: Geoffrey Walker MRN: [id number] Date of Birth: [birth year]  Transition of Care [name]) CM/SW Contact  Lauraine JAYSON Carpen, LCSW Phone Number: 11/18/2024, 9:41 AM  Clinical Narrative:  SNF insurance authorization is still pending. [facility] is currently at bed capacity and will not have a bed open up until Sunday. They are going to call their sister facility, [facility], to see if they would be able to accept him.   11:19 am: CSW received call from [facility] liaison. [facility] does not require prior authorization for short-term rehab. CSW left voicemail for [facility].  11:32 am: Yesterday's MD did not think that patient would need bipap at discharge.  1:21 pm: [facility] can offer patient a bed as soon as tomorrow. CSW mentioned not requiring auth to the admissions coordinator. She will have to check on this and will follow up.  2:29 pm: Updated patient. With patient's permission, also called his aunt and provided update.  3:03 pm: [employer] business office spoke to their insurance person who said they do have to start auth. CSW updated patient and he is agreeable to plan since [facility] does not have a bed right now. CSW updated [employer].  3:21 pm: Received call from aunt. Provided updated.  Expected Discharge Plan and Services                                               Social Drivers of Health (SDOH) Interventions SDOH Screenings   Food Insecurity: No Food Insecurity (11/09/2024)  Housing: Low Risk (11/09/2024)  Transportation Needs: No Transportation Needs (11/09/2024)  Utilities: Not At Risk (11/09/2024)  Financial Resource Strain: Low Risk (09/21/2023)   Received from [facility]  Tobacco Use: High Risk (11/07/2024)    Readmission Risk Interventions     No data to display

## 2024-11-18 NOTE — Plan of Care (Signed)

## 2024-11-18 NOTE — Hospital Course (Addendum)
 64 y.o. male with medical history significant of lung cancer status post L pneumonectomy, COPD, chronic hypoxemic respiratory failure in the setting of COPD on 2 L at baseline, hypertension, chronic venous stasis, tobacco dependence presented with worsening shortness of breath since 1 week.    Assessment and Plan:   Concern for sepsis (POA) - Lactic acidosis, leukocytosis, worsening hypoxia, influenza A on presentation given concern for sepsis.  Appears to be resolved at this time.  Blood pressure stable.   Acute on chronic hypoxic respiratory failure - Initially requiring BiPAP.  Able to be weaned down to 4 L nasal cannula.  Patient's baseline 2-5 L nasal cannula.  He titrates O2 based on his perceived shortness of breath.  O2 goal 88-92.   Acute influenza A with concern for aspiration versus HAP - Rapid screen positive.  Out of the window for Tamiflu .  Appears to be resolving, back to baseline O2.  Still having purulent cough with sputum however.  Empiric cefepime  on board.  Flutter valve, incentive spirometer.  O2 at baseline.  Chest PT.   Elevated troponin - Minimally elevated and flat suggesting demand ischemia.   Hypertension - Currently on Coreg .   Tobacco abuse - Reports that he smokes 1-2 cigarettes/day.  Recommend cessation.   Hypokalemia/hypophosphatemia - Replenishment on board.  Will recheck BMP in AM.   Physical debilitation muscle weakness - Evaluated by PT/OT.  Recommending SNF.  Working closely with TOC on disposition planning.  Patient likely to transition to [facility] pending bed availability

## 2024-11-19 DIAGNOSIS — Z720 Tobacco use: Secondary | ICD-10-CM | POA: Diagnosis not present

## 2024-11-19 DIAGNOSIS — J9621 Acute and chronic respiratory failure with hypoxia: Secondary | ICD-10-CM | POA: Diagnosis not present

## 2024-11-19 DIAGNOSIS — J09X1 Influenza due to identified novel influenza A virus with pneumonia: Secondary | ICD-10-CM | POA: Diagnosis not present

## 2024-11-19 LAB — BASIC METABOLIC PANEL WITH GFR
Anion gap: 4 — ABNORMAL LOW (ref 5–15)
BUN: 28 mg/dL — ABNORMAL HIGH (ref 8–23)
CO2: 31 mmol/L (ref 22–32)
Calcium: 7.9 mg/dL — ABNORMAL LOW (ref 8.9–10.3)
Chloride: 100 mmol/L (ref 98–111)
Creatinine, Ser: 0.76 mg/dL (ref 0.61–1.24)
GFR, Estimated: >60 mL/min
Glucose, Bld: 102 mg/dL — ABNORMAL HIGH (ref 70–99)
Potassium: 4.3 mmol/L (ref 3.5–5.1)
Sodium: 136 mmol/L (ref 135–145)

## 2024-11-19 LAB — CBC
HCT: 32.4 % — ABNORMAL LOW (ref 39.0–52.0)
Hemoglobin: 10.4 g/dL — ABNORMAL LOW (ref 13.0–17.0)
MCH: 29.2 pg (ref 26.0–34.0)
MCHC: 32.1 g/dL (ref 30.0–36.0)
MCV: 91 fL (ref 80.0–100.0)
Platelets: 130 K/uL — ABNORMAL LOW (ref 150–400)
RBC: 3.56 MIL/uL — ABNORMAL LOW (ref 4.22–5.81)
RDW: 13.5 % (ref 11.5–15.5)
WBC: 8.1 K/uL (ref 4.0–10.5)
nRBC: 0 % (ref 0.0–0.2)

## 2024-11-19 LAB — MAGNESIUM: Magnesium: 2 mg/dL (ref 1.7–2.4)

## 2024-11-19 MED ORDER — IPRATROPIUM-ALBUTEROL 0.5-2.5 (3) MG/3ML IN SOLN
3.0000 mL | Freq: Two times a day (BID) | RESPIRATORY_TRACT | Status: DC
  Administered 2024-11-19 – 2024-11-27 (×15): 3 mL via RESPIRATORY_TRACT
  Filled 2024-11-19 (×15): qty 3

## 2024-11-19 NOTE — TOC Progression Note (Addendum)
 Transition of Care [facility]) - Progression Note    Patient Details  Name: Geoffrey Walker MRN: [id number] Date of Birth: 12/31/1960  Transition of Care [facility]) CM/SW Contact  Lauraine JAYSON Carpen, LCSW Phone Number: 11/19/2024, 9:03 AM  Clinical Narrative:   SNF auth still pending.  12:32 pm: Auth still pending.  Expected Discharge Plan and Services                                               Social Drivers of Health (SDOH) Interventions SDOH Screenings   Food Insecurity: No Food Insecurity (11/09/2024)  Housing: Low Risk (11/09/2024)  Transportation Needs: No Transportation Needs (11/09/2024)  Utilities: Not At Risk (11/09/2024)  Financial Resource Strain: Low Risk (09/21/2023)   Received from [facility]  Tobacco Use: High Risk (11/07/2024)    Readmission Risk Interventions     No data to display

## 2024-11-19 NOTE — Progress Notes (Signed)
 Pt refused respiratory nebulizers this AM which was the case on 11-18-24.  Pt either does not want to stop eating or wants to continue to sleep.  Pt has albuterol  Q2PRN if he feels he needs a treatment

## 2024-11-19 NOTE — Plan of Care (Signed)
   Problem: Fluid Volume: Goal: Hemodynamic stability will improve Outcome: Progressing

## 2024-11-19 NOTE — Progress Notes (Signed)
 " Progress Note   Patient: Geoffrey Walker FMW:[id number] DOB: 19-May-1961 DOA: 11/07/2024  DOS: the patient was seen and examined on 11/19/2024   Brief hospital course:  64 y.o. male with medical history significant of lung cancer status post L pneumonectomy, COPD, chronic hypoxemic respiratory failure in the setting of COPD on 2 L at baseline, hypertension, chronic venous stasis, tobacco dependence presented with worsening shortness of breath since 1 week.    Assessment and Plan:   Concern for sepsis (POA) - Lactic acidosis, leukocytosis, worsening hypoxia, influenza A on presentation given concern for sepsis.  Appears to be resolved at this time.  Blood pressure stable.   Acute on chronic hypoxic respiratory failure - Initially requiring BiPAP.  Able to be weaned down to 4 L nasal cannula.  Patient's baseline 2-5 L nasal cannula.  He titrates O2 based on his perceived shortness of breath.  O2 goal 88-92.   Acute influenza A with concern for aspiration versus HAP - Rapid screen positive.  Out of the window for Tamiflu .  Appears to be resolving, back to baseline O2.  Still having purulent cough with sputum however.  Empiric cefepime  on board.  Flutter valve, incentive spirometer.  O2 at baseline.  Chest PT.   Elevated troponin - Minimally elevated and flat suggesting demand ischemia.   Hypertension - Currently on Coreg .   Tobacco abuse - Reports that he smokes 1-2 cigarettes/day.  Recommend cessation.   Hypokalemia/hypophosphatemia - Replenishment on board.  Will recheck BMP in AM.   Physical debilitation muscle weakness - Evaluated by PT/OT.  Recommending SNF.  Working closely with TOC on disposition planning.  Patient likely to transition to [facility] pending bed availability   Subjective: Patient resting comfortably this morning.  Complains of shortness of breath but appears to be his baseline.  Still having cough.  Denies any fever, chest pain, nausea, vomiting, abdominal  pain.  Physical Exam:  Vitals:   11/19/24 0422 11/19/24 0500 11/19/24 0800 11/19/24 1248  BP: (!) 122/59  132/75 (!) 100/50  Pulse: 70  63 68  Resp: 18  14 16   Temp: (!) 97.5 F (36.4 C)  97.9 F (36.6 C) (!) 97.5 F (36.4 C)  TempSrc:   Axillary   SpO2: 100%  100% 100%  Weight:  96.6 kg    Height:        GENERAL:  Alert, pleasant, no acute distress, disheveled HEENT:  EOMI, nasal cannula CARDIOVASCULAR:  RRR, no murmurs appreciated RESPIRATORY: Productive cough, poor air movement bilaterally GASTROINTESTINAL:  Soft, nontender, nondistended EXTREMITIES:  No LE edema bilaterally NEURO:  No new focal deficits appreciated SKIN:  No rashes noted PSYCH:  Appropriate mood and affect    Data Reviewed:  Imaging Studies: MR BRAIN W WO CONTRAST Result Date: 11/15/2024 EXAM: MRI BRAIN WITH AND WITHOUT CONTRAST 11/15/2024 11:46:43 AM TECHNIQUE: Multiplanar multisequence MRI of the head/brain was performed with and without the administration of 9 mL gadobutrol  (GADAVIST ) 1 MMOL/ML IV SOLN. COMPARISON: None available. CLINICAL HISTORY: Palate weakness (CN 9) and possible aspiration. History of lung cancer. FINDINGS: BRAIN AND VENTRICLES: No acute infarct. No acute intracranial hemorrhage. No mass effect or midline shift. No hydrocephalus. Ventricular and scattered subcortical T2 hyperintensities are moderately advanced for age. Mild generalized atrophy is present. The sella is unremarkable. Normal flow voids. No mass or abnormal enhancement. ORBITS: No acute abnormality. SINUSES: Mild mucosal thickening is present in the inferior maxillary sinuses bilaterally. BONES AND SOFT TISSUES: Normal bone marrow signal and enhancement. Bilateral mastoid effusions  are present. IMPRESSION: 1. No acute intracranial abnormality. 2. Moderately advanced ventricular and scattered subcortical T2 hyperintensities for age. Electronically signed by: Lonni Necessary MD 11/15/2024 12:01 PM EST RP Workstation:  [id number]   DG Chest Port 1 View Result Date: 11/15/2024 EXAM: 1 VIEW(S) XRAY OF THE CHEST 11/15/2024 05:09:00 AM COMPARISON: CXR 11/13/2024 and CT angio chest 11/13/2024. CLINICAL HISTORY: Shortness of breath. FINDINGS: LUNGS AND PLEURA: Right pneumonectomy. Right hemithorax opacification with rightward mediastinal shift, unchanged and consistent with postsurgical changes. Surgical clips are present over the right hilum. No focal pulmonary opacity is seen in the left lung. No pleural effusion or pneumothorax is identified in the left hemithorax. HEART AND MEDIASTINUM: The mediastinal silhouette is shifted rightward due to postsurgical changes. BONES AND SOFT TISSUES: Redemonstration of widening of the seventh and eighth ribs in the setting of known intrathoracic right posterolateral chest wall herniation, best seen on CT angio chest 11/13/2024. IMPRESSION: 1. Unchanged right pneumonectomy with right hemithorax opacification and rightward mediastinal shift, consistent with postsurgical changes. 2. Widening of the seventh and eighth ribs in the setting of known intrathoracic right posterolateral chest wall herniation , best seen on CT angio chest 11/13/2024. Electronically signed by: Morgane Naveau MD 11/15/2024 08:19 AM EST RP Workstation: [id number]   ECHOCARDIOGRAM COMPLETE Result Date: 11/13/2024    ECHOCARDIOGRAM REPORT   Patient Name:   Geoffrey Walker Date of Exam: 11/13/2024 Medical Rec #:  [id number]     Height:       70.0 in Accession #:    [id number]    Weight:       200.6 lb Date of Birth:  26-Feb-1961     BSA:          2.090 m Patient Age:    63 years      BP:           144/100 mmHg Patient Gender: M             HR:           87 bpm. Exam Location:  [facility] Procedure: 2D Echo, Cardiac Doppler and Color Doppler (Both Spectral and Color            Flow Doppler were utilized during procedure). Indications:     Dyspnea R06.00  History:         Patient has no prior history of Echocardiogram examinations.                   Signs/Symptoms:Dyspnea.  Sonographer:     Rosina Dunk Referring Phys:  [id number] DELAYNE LULLA SOLIAN Diagnosing Phys: Keller Paterson  Sonographer Comments: Technically difficult study due to poor echo windows and no apical window. IMPRESSIONS  1. Poor study quality. No apical views and limited images in other views.  2. Left ventricle incompletely visualized. Normal systolic function, LVEF 55-60% based on limited parasternal views. Wall motion analysis cannot be assessed.  3. Right ventrile not well visualized. Appears dilated with reduced function based on very limited views.  4. The mitral valve was not well visualized. No evidence of mitral valve regurgitation.  5. The aortic valve was not well visualized. Aortic valve regurgitation is not visualized. FINDINGS  Left Ventricle: Left ventricle incompletely visualized. Normal systolic function, LVEF 55-60% based on limited parasternal views. Wall motion analysis cannot be assessed. Right Ventricle: Right ventrile not well visualized. Appears dilated with reduced function based on very limited views. Left Atrium: Left atrial size was not well visualized. Right Atrium: Right atrial size was  not well visualized. Pericardium: There is no evidence of pericardial effusion. Mitral Valve: The mitral valve was not well visualized. No evidence of mitral valve regurgitation. Tricuspid Valve: The tricuspid valve is not well visualized. Tricuspid valve regurgitation is trivial. Aortic Valve: The aortic valve was not well visualized. Aortic valve regurgitation is not visualized. Pulmonic Valve: The pulmonic valve was not well visualized. Pulmonic valve regurgitation is not visualized. Aorta: The aortic root is normal in size and structure. IAS/Shunts: The interatrial septum was not well visualized.  LEFT VENTRICLE PLAX 2D LVIDd:         4.00 cm LVIDs:         3.10 cm LV PW:         1.10 cm LV IVS:        1.50 cm LVOT diam:     2.20 cm LVOT Area:     3.80 cm  IVC IVC  diam: 1.80 cm LEFT ATRIUM         Index LA diam:    3.80 cm 1.82 cm/m                        PULMONIC VALVE AORTA                 PV Vmax:        0.63 m/s Ao Root diam: 3.20 cm PV Vmean:       43.600 cm/s                       PV VTI:         0.096 m                       PV Peak grad:   1.6 mmHg                       PV Mean grad:   1.0 mmHg                       RVOT Peak grad: 1 mmHg   SHUNTS Systemic Diam: 2.20 cm Pulmonic VTI:  0.095 m Keller Paterson Electronically signed by Keller Paterson Signature Date/Time: 11/13/2024/5:27:57 PM    Final    CT Angio Chest Pulmonary Embolism (PE) W or WO Contrast Result Date: 11/13/2024 EXAM: CTA CHEST 11/13/2024 08:15:00 AM TECHNIQUE: CTA of the chest was performed without and with the administration of 75 mL of iohexol  (OMNIPAQUE ) 350 MG/ML injection. Multiplanar reformatted images are provided for review. MIP images are provided for review. Automated exposure control, iterative reconstruction, and/or weight based adjustment of the mA/kV was utilized to reduce the radiation dose to as low as reasonably achievable. COMPARISON: None available. CLINICAL HISTORY: Pulmonary embolism (PE) suspected, high prob. FINDINGS: PULMONARY ARTERIES: Pulmonary arteries are adequately opacified for evaluation. No acute pulmonary embolus. Main pulmonary artery is normal in caliber. MEDIASTINUM: The heart and mediastinal structures are shifted to the right. The heart and pericardium demonstrate no acute abnormality. There is no acute abnormality of the thoracic aorta. LYMPH NODES: No mediastinal, hilar or axillary lymphadenopathy. LUNGS AND PLEURA: The patient is status post right pneumonectomy. There are numerous irregular patchy reticular opacities present within the periphery of the left lung. There is mild central lobular emphysema. No evidence of pleural effusion or pneumothorax. UPPER ABDOMEN: Limited images of the upper abdomen demonstrate stones independently within the gallbladder.  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 No acute soft tissue abnormality. IMPRESSION: 1. No evidence of pulmonary embolus. 2. 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 3. Numerous irregular patchy reticular opacities in the periphery of the left lung. 4. Mild central lobular emphysema. Pulmonary emphysema is an independent risk factor for lung cancer. Recommend consideration for evaluation for a low-dose CT lung cancer screening program. 5. Cholelithiasis. Electronically signed by: Evalene Coho MD 11/13/2024 08:39 AM EST RP Workstation: [id number]   DG Chest Port 1 View Result Date: 11/13/2024 EXAM: 1 VIEW(S) XRAY OF THE CHEST 11/13/2024 02:37:50 AM COMPARISON: 11/12/2024 CLINICAL HISTORY: Shortness of breath FINDINGS: LUNGS AND PLEURA: Right pneumonectomy. Complete opacification of right hemithorax. HEART AND MEDIASTINUM: Rightward mediastinal shift, unchanged. BONES AND SOFT TISSUES: No acute osseous abnormality. IMPRESSION: 1. Right pneumonectomy with complete opacification of the right hemithorax. 2. No acute findings. Electronically signed by: Dorethia Molt MD 11/13/2024 03:36 AM EST RP Workstation: [id number]   DG Chest Port 1 View Result Date: 11/12/2024 CLINICAL DATA:  Dyspnea. EXAM: PORTABLE CHEST 1 VIEW COMPARISON:  11/07/2024 FINDINGS: Complete opacification of the right hemithorax with rightward mediastinal shift, unchanged from prior exam. Peribronchial thickening in the left lung with subsegmental scarring in the periphery of the midlung zone. No confluent opacity. No pneumothorax or  significant left pleural effusion. IMPRESSION: 1. Complete opacification of the right hemithorax with rightward mediastinal shift, consistent with prior pneumonectomy. 2. Peribronchial thickening in the left lung with subsegmental scarring in the periphery of the midlung zone. Electronically Signed   By: Andrea Gasman M.D.   On: 11/12/2024 18:50   DG Chest Port 1 View Result Date: 11/07/2024 CLINICAL DATA:  Shortness of breath. Status post RIGHT pneumonectomy 5 years ago. EXAM: PORTABLE CHEST - 1 VIEW COMPARISON:  02/04/2016 FINDINGS: Complete opacification of the RIGHT hemithorax with RIGHT mediastinal shift consistent with provided history of a total pneumonectomy. Mild scattered atelectasis of the LEFT lung without focal airspace opacity indicative pneumonia. IMPRESSION: 1. Complete opacification of the RIGHT hemithorax consistent with provided history of total pneumonectomy. 2. No acute cardiopulmonary process. Electronically Signed   By: Aliene Lloyd M.D.   On: 11/07/2024 16:16    There are no new results to review at this time.  Previous records (including but not limited to H&P, progress notes, nursing notes, TOC management) were reviewed in assessment of this patient.  Labs: CBC: Recent Labs  Lab 11/14/24 0354 11/15/24 0342 11/19/24 0409  WBC 4.6 10.6* 8.1  HGB 11.7* 11.1* 10.4*  HCT 35.0* 32.5* 32.4*  MCV 87.3 86.9 91.0  PLT 184 154 130*   Basic Metabolic Panel: Recent Labs  Lab 11/14/24 0354 11/14/24 2023 11/15/24 0342 11/19/24 0409  NA 137 141  --  136  K 4.5 4.6  --  4.3  CL 94* 97*  --  100  CO2 37* 37*  --  31  GLUCOSE 150* 122*  --  102*  BUN 33* 35*  --  28*  CREATININE 0.85 0.80  --  0.76  CALCIUM  8.5* 8.5*  --  7.9*  MG 2.1  --  2.2 2.0  PHOS 4.1  --  3.1  --    Liver Function Tests: No  results for input(s): AST, ALT, ALKPHOS, BILITOT, PROT, ALBUMIN in the last 168 hours. CBG: Recent Labs  Lab 11/15/24 1203 11/15/24 1802 11/16/24 0929  11/16/24 1240 11/16/24 2027  GLUCAP 142* 123* 120* 115* 122*    Scheduled Meds:  budesonide  (PULMICORT ) nebulizer solution  0.5 mg Nebulization BID   carvedilol   12.5 mg Oral BID WC   Chlorhexidine  Gluconate Cloth  6 each Topical Daily   enoxaparin  (LOVENOX ) injection  40 mg Subcutaneous Q24H   famotidine   20 mg Oral Daily   ipratropium-albuterol   3 mL Nebulization BID   Continuous Infusions: PRN Meds:.acetaminophen  **OR** acetaminophen , albuterol , hydrOXYzine , LORazepam , menthol , morphine  injection, ondansetron  **OR** ondansetron  (ZOFRAN ) IV, traZODone   Family Communication: None at bedside  Disposition: Status is: Inpatient Remains inpatient appropriate because: Dispo     Time spent: 33 minutes  Length of inpatient stay: 12 days  Author: Carliss LELON Canales, DO 11/19/2024 1:14 PM  For on call review [email or web address].   "

## 2024-11-20 MED ORDER — ALPRAZOLAM 0.25 MG PO TABS
0.2500 mg | ORAL_TABLET | Freq: Two times a day (BID) | ORAL | Status: DC | PRN
  Administered 2024-11-20 – 2024-11-26 (×4): 0.25 mg via ORAL
  Filled 2024-11-20 (×4): qty 1

## 2024-11-20 MED ORDER — LIDOCAINE 5 % EX PTCH
1.0000 | MEDICATED_PATCH | CUTANEOUS | Status: DC
  Administered 2024-11-20 – 2024-11-26 (×7): 1 via TRANSDERMAL
  Filled 2024-11-20 (×7): qty 1

## 2024-11-20 MED ORDER — BISACODYL 5 MG PO TBEC
5.0000 mg | DELAYED_RELEASE_TABLET | Freq: Every day | ORAL | Status: DC | PRN
  Administered 2024-11-20 – 2024-11-22 (×2): 5 mg via ORAL
  Filled 2024-11-20 (×2): qty 1

## 2024-11-20 MED ORDER — CYCLOBENZAPRINE HCL 10 MG PO TABS
5.0000 mg | ORAL_TABLET | Freq: Three times a day (TID) | ORAL | Status: DC | PRN
  Administered 2024-11-20 – 2024-11-25 (×5): 5 mg via ORAL
  Filled 2024-11-20 (×5): qty 1

## 2024-11-20 MED ORDER — ATORVASTATIN CALCIUM 20 MG PO TABS
40.0000 mg | ORAL_TABLET | Freq: Every day | ORAL | Status: DC
  Administered 2024-11-20 – 2024-11-27 (×8): 40 mg via ORAL
  Filled 2024-11-20 (×8): qty 2

## 2024-11-20 MED ORDER — GUAIFENESIN 100 MG/5ML PO LIQD
5.0000 mL | ORAL | Status: DC | PRN
  Administered 2024-11-26: 5 mL via ORAL
  Filled 2024-11-20 (×2): qty 10

## 2024-11-20 MED ORDER — OXYCODONE HCL 5 MG PO TABS
5.0000 mg | ORAL_TABLET | ORAL | Status: DC | PRN
  Administered 2024-11-20 – 2024-11-27 (×21): 5 mg via ORAL
  Filled 2024-11-20 (×22): qty 1

## 2024-11-20 MED ORDER — POLYETHYLENE GLYCOL 3350 17 G PO PACK
17.0000 g | PACK | Freq: Every day | ORAL | Status: DC
  Administered 2024-11-20 – 2024-11-24 (×5): 17 g via ORAL
  Filled 2024-11-20 (×6): qty 1

## 2024-11-20 NOTE — Progress Notes (Signed)
 " Progress Note   Patient: Geoffrey Walker FMW:[id number] DOB: 16-Sep-1961 DOA: 11/07/2024  DOS: the patient was seen and examined on 11/20/2024   Brief hospital course:  64 y.o. male with medical history significant of lung cancer status post L pneumonectomy, COPD, chronic hypoxemic respiratory failure in the setting of COPD on 2 L at baseline, hypertension, chronic venous stasis, tobacco dependence presented with worsening shortness of breath since 1 week.    Assessment and Plan:   Concern for sepsis (POA) - Lactic acidosis, leukocytosis, worsening hypoxia, influenza A on presentation given concern for sepsis.  Appears to be resolved at this time.  Blood pressure stable.   Acute on chronic hypoxic respiratory failure - Initially requiring BiPAP.  Able to be weaned down to 4 L nasal cannula.  Patient's baseline 2-5 L nasal cannula.  He titrates O2 based on his perceived shortness of breath.  O2 goal 88-92.   Acute influenza A with concern for aspiration versus HAP - Rapid screen positive.  Out of the window for Tamiflu .  Appears to be resolving, back to baseline O2.  Still with intermittent cough.  Completed Empiric cefepime  and azithromycin .  Flutter valve, incentive spirometer.  O2 at baseline.  Chest PT.   Elevated troponin - Minimally elevated and flat suggesting demand ischemia.   Hypertension - Currently on Coreg .   Tobacco abuse - Reports that he smokes 1-2 cigarettes/day.  Recommend cessation.   Hypokalemia/hypophosphatemia - Replenishment on board.  Will recheck BMP in AM.   Physical debilitation muscle weakness - Evaluated by PT/OT.  Recommending SNF.  Working closely with TOC on disposition planning.  Patient likely to transition to [facility] pending bed availability.   Subjective: Patient resting comfortably this morning.  Cough appears improved.  Denies any fever, chest pain, nausea, vomiting, abdominal pain.  Physical Exam:  Vitals:   11/19/24 1723 11/19/24 2055  11/20/24 0405 11/20/24 0727  BP: 105/82 101/60 117/60 110/76  Pulse: 71 79 64 62  Resp: 15 18 18 17   Temp: 97.8 F (36.6 C) 100.1 F (37.8 C) 98.2 F (36.8 C) 97.7 F (36.5 C)  TempSrc: Axillary     SpO2: 100% 99% 100% 98%  Weight:      Height:        GENERAL:  Alert, pleasant, no acute distress, disheveled HEENT:  EOMI, nasal cannula CARDIOVASCULAR:  RRR, no murmurs appreciated RESPIRATORY: poor air movement bilaterally GASTROINTESTINAL:  Soft, nontender, nondistended EXTREMITIES:  No LE edema bilaterally NEURO:  No new focal deficits appreciated SKIN:  No rashes noted PSYCH:  Appropriate mood and affect    Data Reviewed:  Imaging Studies: MR BRAIN W WO CONTRAST Result Date: 11/15/2024 EXAM: MRI BRAIN WITH AND WITHOUT CONTRAST 11/15/2024 11:46:43 AM TECHNIQUE: Multiplanar multisequence MRI of the head/brain was performed with and without the administration of 9 mL gadobutrol  (GADAVIST ) 1 MMOL/ML IV SOLN. COMPARISON: None available. CLINICAL HISTORY: Palate weakness (CN 9) and possible aspiration. History of lung cancer. FINDINGS: BRAIN AND VENTRICLES: No acute infarct. No acute intracranial hemorrhage. No mass effect or midline shift. No hydrocephalus. Ventricular and scattered subcortical T2 hyperintensities are moderately advanced for age. Mild generalized atrophy is present. The sella is unremarkable. Normal flow voids. No mass or abnormal enhancement. ORBITS: No acute abnormality. SINUSES: Mild mucosal thickening is present in the inferior maxillary sinuses bilaterally. BONES AND SOFT TISSUES: Normal bone marrow signal and enhancement. Bilateral mastoid effusions are present. IMPRESSION: 1. No acute intracranial abnormality. 2. Moderately advanced ventricular and scattered subcortical T2 hyperintensities for  age. Electronically signed by: Lonni Necessary MD 11/15/2024 12:01 PM EST RP Workstation: [id number]   DG Chest Port 1 View Result Date: 11/15/2024 EXAM: 1 VIEW(S) XRAY OF  THE CHEST 11/15/2024 05:09:00 AM COMPARISON: CXR 11/13/2024 and CT angio chest 11/13/2024. CLINICAL HISTORY: Shortness of breath. FINDINGS: LUNGS AND PLEURA: Right pneumonectomy. Right hemithorax opacification with rightward mediastinal shift, unchanged and consistent with postsurgical changes. Surgical clips are present over the right hilum. No focal pulmonary opacity is seen in the left lung. No pleural effusion or pneumothorax is identified in the left hemithorax. HEART AND MEDIASTINUM: The mediastinal silhouette is shifted rightward due to postsurgical changes. BONES AND SOFT TISSUES: Redemonstration of widening of the seventh and eighth ribs in the setting of known intrathoracic right posterolateral chest wall herniation, best seen on CT angio chest 11/13/2024. IMPRESSION: 1. Unchanged right pneumonectomy with right hemithorax opacification and rightward mediastinal shift, consistent with postsurgical changes. 2. Widening of the seventh and eighth ribs in the setting of known intrathoracic right posterolateral chest wall herniation , best seen on CT angio chest 11/13/2024. Electronically signed by: Morgane Naveau MD 11/15/2024 08:19 AM EST RP Workstation: [id number]   ECHOCARDIOGRAM COMPLETE Result Date: 11/13/2024    ECHOCARDIOGRAM REPORT   Patient Name:   Geoffrey Walker Date of Exam: 11/13/2024 Medical Rec #:  [id number]     Height:       70.0 in Accession #:    [id number]    Weight:       200.6 lb Date of Birth:  January 18, 1961     BSA:          2.090 m Patient Age:    63 years      BP:           144/100 mmHg Patient Gender: M             HR:           87 bpm. Exam Location:  [facility] Procedure: 2D Echo, Cardiac Doppler and Color Doppler (Both Spectral and Color            Flow Doppler were utilized during procedure). Indications:     Dyspnea R06.00  History:         Patient has no prior history of Echocardiogram examinations.                  Signs/Symptoms:Dyspnea.  Sonographer:     Rosina Dunk Referring  Phys:  [id number] DELAYNE LULLA SOLIAN Diagnosing Phys: Keller Paterson  Sonographer Comments: Technically difficult study due to poor echo windows and no apical window. IMPRESSIONS  1. Poor study quality. No apical views and limited images in other views.  2. Left ventricle incompletely visualized. Normal systolic function, LVEF 55-60% based on limited parasternal views. Wall motion analysis cannot be assessed.  3. Right ventrile not well visualized. Appears dilated with reduced function based on very limited views.  4. The mitral valve was not well visualized. No evidence of mitral valve regurgitation.  5. The aortic valve was not well visualized. Aortic valve regurgitation is not visualized. FINDINGS  Left Ventricle: Left ventricle incompletely visualized. Normal systolic function, LVEF 55-60% based on limited parasternal views. Wall motion analysis cannot be assessed. Right Ventricle: Right ventrile not well visualized. Appears dilated with reduced function based on very limited views. Left Atrium: Left atrial size was not well visualized. Right Atrium: Right atrial size was not well visualized. Pericardium: There is no evidence of pericardial effusion. Mitral Valve: The mitral valve was not  well visualized. No evidence of mitral valve regurgitation. Tricuspid Valve: The tricuspid valve is not well visualized. Tricuspid valve regurgitation is trivial. Aortic Valve: The aortic valve was not well visualized. Aortic valve regurgitation is not visualized. Pulmonic Valve: The pulmonic valve was not well visualized. Pulmonic valve regurgitation is not visualized. Aorta: The aortic root is normal in size and structure. IAS/Shunts: The interatrial septum was not well visualized.  LEFT VENTRICLE PLAX 2D LVIDd:         4.00 cm LVIDs:         3.10 cm LV PW:         1.10 cm LV IVS:        1.50 cm LVOT diam:     2.20 cm LVOT Area:     3.80 cm  IVC IVC diam: 1.80 cm LEFT ATRIUM         Index LA diam:    3.80 cm 1.82 cm/m                         PULMONIC VALVE AORTA                 PV Vmax:        0.63 m/s Ao Root diam: 3.20 cm PV Vmean:       43.600 cm/s                       PV VTI:         0.096 m                       PV Peak grad:   1.6 mmHg                       PV Mean grad:   1.0 mmHg                       RVOT Peak grad: 1 mmHg   SHUNTS Systemic Diam: 2.20 cm Pulmonic VTI:  0.095 m Keller Paterson Electronically signed by Keller Paterson Signature Date/Time: 11/13/2024/5:27:57 PM    Final    CT Angio Chest Pulmonary Embolism (PE) W or WO Contrast Result Date: 11/13/2024 EXAM: CTA CHEST 11/13/2024 08:15:00 AM TECHNIQUE: CTA of the chest was performed without and with the administration of 75 mL of iohexol  (OMNIPAQUE ) 350 MG/ML injection. Multiplanar reformatted images are provided for review. MIP images are provided for review. Automated exposure control, iterative reconstruction, and/or weight based adjustment of the mA/kV was utilized to reduce the radiation dose to as low as reasonably achievable. COMPARISON: None available. CLINICAL HISTORY: Pulmonary embolism (PE) suspected, high prob. FINDINGS: PULMONARY ARTERIES: Pulmonary arteries are adequately opacified for evaluation. No acute pulmonary embolus. Main pulmonary artery is normal in caliber. MEDIASTINUM: The heart and mediastinal structures are shifted to the right. The heart and pericardium demonstrate no acute abnormality. There is no acute abnormality of the thoracic aorta. LYMPH NODES: No mediastinal, hilar or axillary lymphadenopathy. LUNGS AND PLEURA: The patient is status post right pneumonectomy. There are numerous irregular patchy reticular opacities present within the periphery of the left lung. There is mild central lobular emphysema. No evidence of pleural effusion or pneumothorax. UPPER ABDOMEN: Limited images of the upper abdomen demonstrate stones independently within the gallbladder. 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 No acute soft tissue abnormality. IMPRESSION: 1. No evidence of pulmonary embolus. 2. 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 3. Numerous irregular patchy reticular opacities in the periphery of the left lung. 4. Mild central lobular emphysema. Pulmonary emphysema is an independent risk factor for lung cancer. Recommend consideration for evaluation for a low-dose CT lung cancer screening program. 5. Cholelithiasis. Electronically signed by: Evalene Coho MD 11/13/2024 08:39 AM EST RP Workstation: [id number]   DG Chest Port 1 View Result Date: 11/13/2024 EXAM: 1 VIEW(S) XRAY OF THE CHEST 11/13/2024 02:37:50 AM COMPARISON: 11/12/2024 CLINICAL HISTORY: Shortness of breath FINDINGS: LUNGS AND PLEURA: Right pneumonectomy. Complete opacification of right hemithorax. HEART AND MEDIASTINUM: Rightward mediastinal shift, unchanged. BONES AND SOFT TISSUES: No acute osseous abnormality. IMPRESSION: 1. Right pneumonectomy with complete opacification of the right hemithorax. 2. No acute findings. Electronically signed by: Dorethia Molt MD 11/13/2024 03:36 AM EST RP Workstation: [id number]   DG Chest Port 1 View Result Date: 11/12/2024 CLINICAL DATA:  Dyspnea. EXAM: PORTABLE CHEST 1 VIEW COMPARISON:  11/07/2024 FINDINGS: Complete opacification of the right hemithorax with rightward mediastinal shift, unchanged from prior exam. Peribronchial thickening in the left lung with subsegmental scarring in the periphery of the midlung zone. No confluent opacity. No pneumothorax or significant left pleural effusion. IMPRESSION: 1. Complete opacification of the  right hemithorax with rightward mediastinal shift, consistent with prior pneumonectomy. 2. Peribronchial thickening in the left lung with subsegmental scarring in the periphery of the midlung zone. Electronically Signed   By: Andrea Gasman M.D.   On: 11/12/2024 18:50   DG Chest Port 1 View Result Date: 11/07/2024 CLINICAL DATA:  Shortness of breath. Status post RIGHT pneumonectomy 5 years ago. EXAM: PORTABLE CHEST - 1 VIEW COMPARISON:  02/04/2016 FINDINGS: Complete opacification of the RIGHT hemithorax with RIGHT mediastinal shift consistent with provided history of a total pneumonectomy. Mild scattered atelectasis of the LEFT lung without focal airspace opacity indicative pneumonia. IMPRESSION: 1. Complete opacification of the RIGHT hemithorax consistent with provided history of total pneumonectomy. 2. No acute cardiopulmonary process. Electronically Signed   By: Aliene Lloyd M.D.   On: 11/07/2024 16:16    There are no new results to review at this time.  Previous records (including but not limited to H&P, progress notes, nursing notes, TOC management) were reviewed in assessment of this patient.  Labs: CBC: Recent Labs  Lab 11/14/24 0354 11/15/24 0342 11/19/24 0409  WBC 4.6 10.6* 8.1  HGB 11.7* 11.1* 10.4*  HCT 35.0* 32.5* 32.4*  MCV 87.3 86.9 91.0  PLT 184 154 130*   Basic Metabolic Panel: Recent Labs  Lab 11/14/24 0354 11/14/24 2023 11/15/24 0342 11/19/24 0409  NA 137 141  --  136  K 4.5 4.6  --  4.3  CL 94* 97*  --  100  CO2 37* 37*  --  31  GLUCOSE 150* 122*  --  102*  BUN 33* 35*  --  28*  CREATININE 0.85 0.80  --  0.76  CALCIUM  8.5* 8.5*  --  7.9*  MG 2.1  --  2.2 2.0  PHOS 4.1  --  3.1  --    Liver Function Tests: No results for input(s): AST, ALT, ALKPHOS, BILITOT, PROT, ALBUMIN in the last 168 hours. CBG: Recent Labs  Lab 11/15/24 1203 11/15/24 1802 11/16/24 0929 11/16/24 1240 11/16/24 2027  GLUCAP 142* 123* 120* 115* 122*    Scheduled  Meds:  budesonide  (PULMICORT ) nebulizer solution  0.5 mg Nebulization BID   carvedilol   12.5 mg Oral BID WC   Chlorhexidine  Gluconate Cloth  6 each Topical Daily   enoxaparin  (LOVENOX ) injection  40 mg Subcutaneous Q24H   famotidine   20 mg Oral Daily   ipratropium-albuterol   3 mL Nebulization BID   Continuous Infusions: PRN Meds:.acetaminophen  **OR** acetaminophen , albuterol , hydrOXYzine , LORazepam , menthol , morphine  injection, ondansetron  **OR** ondansetron  (ZOFRAN ) IV, traZODone   Family Communication: None at bedside  Disposition: Status is: Inpatient Remains inpatient appropriate because: Dispo     Time spent: 32 minutes  Length of inpatient stay: 13 days  Author: Carliss LELON Canales, DO 11/20/2024 10:28 AM  For on call review [email or web address].   "

## 2024-11-20 NOTE — Progress Notes (Signed)
 Patient requests all medications at 2000. States he takes hi medications at 2000 because it takes him along time to fall asleep.

## 2024-11-20 NOTE — Plan of Care (Signed)
" °  Problem: Respiratory: Goal: Ability to maintain adequate ventilation will improve Outcome: Progressing   Problem: Health Behavior/Discharge Planning: Goal: Ability to manage health-related needs will improve Outcome: Progressing   Problem: Clinical Measurements: Goal: Will remain free from infection Outcome: Progressing   Problem: Coping: Goal: Level of anxiety will decrease Outcome: Progressing   "

## 2024-11-20 NOTE — Progress Notes (Signed)
 PT Cancellation Note  Patient Details Name: Geoffrey Walker MRN: [id number] DOB: 05/25/1961   Cancelled Treatment:    Reason Eval/Treat Not Completed: Patient declined, no reason specified Patient declining re-evaluation this date 2/2 difficulty breathing despite spO2 97% on 4L O2. Encouraged patient to participate but continued to refuse. Will re-attempt at later date/time as able.   Maryanne Finder, PT, DPT Physical Therapist - [facility]   Math Brazie A Kion Huntsberry 11/20/2024, 2:49 PM

## 2024-11-20 NOTE — TOC Progression Note (Signed)
 Transition of Care [facility]) - Progression Note    Patient Details  Name: Geoffrey Walker MRN: [id number] Date of Birth: 21-Sep-1961  Transition of [facility]) CM/SW Contact  [name] Hamilton, RN Phone Number: 11/20/2024, 10:03 AM  Clinical Narrative:     TOC notified by [facility] that the prior authorization is still pending. Attending updated.                     Expected Discharge Plan and Services                                               Social Drivers of Health (SDOH) Interventions SDOH Screenings   Food Insecurity: No Food Insecurity (11/09/2024)  Housing: Low Risk (11/09/2024)  Transportation Needs: No Transportation Needs (11/09/2024)  Utilities: Not At Risk (11/09/2024)  Financial Resource Strain: Low Risk (09/21/2023)   Received from [facility]  Tobacco Use: High Risk (11/07/2024)    Readmission Risk Interventions     No data to display

## 2024-11-20 NOTE — Progress Notes (Signed)
 Pt resting with eyes closed. O2 at 5L [place]. Chest expansion symmetrical

## 2024-11-21 DIAGNOSIS — J09X1 Influenza due to identified novel influenza A virus with pneumonia: Secondary | ICD-10-CM | POA: Diagnosis not present

## 2024-11-21 DIAGNOSIS — J9621 Acute and chronic respiratory failure with hypoxia: Secondary | ICD-10-CM | POA: Diagnosis not present

## 2024-11-21 DIAGNOSIS — E876 Hypokalemia: Secondary | ICD-10-CM | POA: Diagnosis not present

## 2024-11-21 LAB — CREATININE, SERUM
Creatinine, Ser: 0.8 mg/dL (ref 0.61–1.24)
GFR, Estimated: >60 mL/min

## 2024-11-21 LAB — GLUCOSE, CAPILLARY: Glucose-Capillary: 135 mg/dL — ABNORMAL HIGH (ref 70–99)

## 2024-11-21 NOTE — Plan of Care (Signed)
  Problem: Clinical Measurements: Goal: Ability to maintain clinical measurements within normal limits will improve Outcome: Progressing   Problem: Pain Managment: Goal: General experience of comfort will improve and/or be controlled Outcome: Progressing   Problem: Safety: Goal: Ability to remain free from injury will improve Outcome: Progressing   Problem: Skin Integrity: Goal: Risk for impaired skin integrity will decrease Outcome: Progressing

## 2024-11-21 NOTE — Progress Notes (Signed)
 PT Cancellation Note  Patient Details Name: Geoffrey Walker MRN: [id number] DOB: 04-01-1961   Cancelled Treatment:    Reason Eval/Treat Not Completed: Patient declined, no reason specified. PT orders received and pt chart reviewed. Pt supine in bed upon entry. Declining participation with PT eval due to fatigue and c/o SOB. Pt educated re: benefits of mobility for maintenance of IND with mobility and improved lung health/function. Pt continues to decline stating he is DC'ing in a couple of days. Pt endorses that he plans to go to short term rehab at DC. PT to continue efforts as appropriate.   Camie CHARLENA Kluver, PT, DPT 12:40 PM,11/21/2024 Physical Therapist - [facility]

## 2024-11-21 NOTE — Progress Notes (Signed)
 " Progress Note   Patient: Geoffrey Walker FMW:[id number] DOB: [birth year] DOA: 11/07/2024  DOS: the patient was seen and examined on 11/21/2024   Brief hospital course:  64 y.o. male with medical history significant of lung cancer status post L pneumonectomy, COPD, chronic hypoxemic respiratory failure in the setting of COPD on 2 L at baseline, hypertension, chronic venous stasis, tobacco dependence presented with worsening shortness of breath since 1 week.    Assessment and Plan:   Concern for sepsis (POA) - Lactic acidosis, leukocytosis, worsening hypoxia, influenza A on presentation given concern for sepsis.  Appears to be resolved at this time.  Blood pressure stable.   Acute on chronic hypoxic respiratory failure - Initially requiring BiPAP.  Able to be weaned down to 4 L nasal cannula.  Patient's baseline 2-5 L nasal cannula.  He titrates O2 based on his perceived shortness of breath.  O2 goal 88-92.   Acute influenza A with concern for aspiration versus HAP - Rapid screen positive.  Out of the window for Tamiflu .  Appears to be resolving, back to baseline O2.  Cough improved.  Completed Empiric cefepime  and azithromycin .  Flutter valve, incentive spirometer.  O2 at baseline.  Chest PT.   Elevated troponin - Minimally elevated and flat suggesting demand ischemia.   Hypertension - Currently on Coreg .   Tobacco abuse - Reports that he smokes 1-2 cigarettes/day.  Recommend cessation.   Hypokalemia/hypophosphatemia - Replenishment on board.  Will recheck BMP in AM.   Physical debilitation muscle weakness - Evaluated by PT/OT.  Recommending SNF.  Working closely with TOC on disposition planning.  Patient likely to transition to [facility] pending bed availability.   Subjective: Patient resting comfortably this morning.  Cough appears improved.  Denies any fever, chest pain, nausea, vomiting, abdominal pain.  Physical Exam:  Vitals:   11/21/24 0451 11/21/24 0454 11/21/24 0734  11/21/24 0849  BP: 129/61  127/60   Pulse: 75  65   Resp: 20  16   Temp: 98.7 F (37.1 C)     TempSrc:      SpO2: 99% 98% 100% 98%  Weight:      Height:        GENERAL:  Alert, pleasant, no acute distress, disheveled HEENT:  EOMI, nasal cannula CARDIOVASCULAR:  RRR, no murmurs appreciated RESPIRATORY: poor air movement bilaterally GASTROINTESTINAL:  Soft, nontender, nondistended EXTREMITIES:  No LE edema bilaterally NEURO:  No new focal deficits appreciated SKIN:  No rashes noted PSYCH:  Appropriate mood and affect    Data Reviewed:  Imaging Studies: MR BRAIN W WO CONTRAST Result Date: 11/15/2024 EXAM: MRI BRAIN WITH AND WITHOUT CONTRAST 11/15/2024 11:46:43 AM TECHNIQUE: Multiplanar multisequence MRI of the head/brain was performed with and without the administration of 9 mL gadobutrol  (GADAVIST ) 1 MMOL/ML IV SOLN. COMPARISON: None available. CLINICAL HISTORY: Palate weakness (CN 9) and possible aspiration. History of lung cancer. FINDINGS: BRAIN AND VENTRICLES: No acute infarct. No acute intracranial hemorrhage. No mass effect or midline shift. No hydrocephalus. Ventricular and scattered subcortical T2 hyperintensities are moderately advanced for age. Mild generalized atrophy is present. The sella is unremarkable. Normal flow voids. No mass or abnormal enhancement. ORBITS: No acute abnormality. SINUSES: Mild mucosal thickening is present in the inferior maxillary sinuses bilaterally. BONES AND SOFT TISSUES: Normal bone marrow signal and enhancement. Bilateral mastoid effusions are present. IMPRESSION: 1. No acute intracranial abnormality. 2. Moderately advanced ventricular and scattered subcortical T2 hyperintensities for age. Electronically signed by: Lonni Necessary MD 11/15/2024 12:01 PM EST  RP Workstation: [id number]   DG Chest Port 1 View Result Date: 11/15/2024 EXAM: 1 VIEW(S) XRAY OF THE CHEST 11/15/2024 05:09:00 AM COMPARISON: CXR 11/13/2024 and CT angio chest 11/13/2024.  CLINICAL HISTORY: Shortness of breath. FINDINGS: LUNGS AND PLEURA: Right pneumonectomy. Right hemithorax opacification with rightward mediastinal shift, unchanged and consistent with postsurgical changes. Surgical clips are present over the right hilum. No focal pulmonary opacity is seen in the left lung. No pleural effusion or pneumothorax is identified in the left hemithorax. HEART AND MEDIASTINUM: The mediastinal silhouette is shifted rightward due to postsurgical changes. BONES AND SOFT TISSUES: Redemonstration of widening of the seventh and eighth ribs in the setting of known intrathoracic right posterolateral chest wall herniation, best seen on CT angio chest 11/13/2024. IMPRESSION: 1. Unchanged right pneumonectomy with right hemithorax opacification and rightward mediastinal shift, consistent with postsurgical changes. 2. Widening of the seventh and eighth ribs in the setting of known intrathoracic right posterolateral chest wall herniation , best seen on CT angio chest 11/13/2024. Electronically signed by: Morgane Naveau MD 11/15/2024 08:19 AM EST RP Workstation: [id number]   ECHOCARDIOGRAM COMPLETE Result Date: 11/13/2024    ECHOCARDIOGRAM REPORT   Patient Name:   Geoffrey Walker Date of Exam: 11/13/2024 Medical Rec #:  [id number]     Height:       70.0 in Accession #:    [id number]    Weight:       200.6 lb Date of Birth:  1961-03-11     BSA:          2.090 m Patient Age:    63 years      BP:           144/100 mmHg Patient Gender: M             HR:           87 bpm. Exam Location:  [facility] Procedure: 2D Echo, Cardiac Doppler and Color Doppler (Both Spectral and Color            Flow Doppler were utilized during procedure). Indications:     Dyspnea R06.00  History:         Patient has no prior history of Echocardiogram examinations.                  Signs/Symptoms:Dyspnea.  Sonographer:     Rosina Dunk Referring Phys:  [id number] DELAYNE LULLA SOLIAN Diagnosing Phys: Keller Paterson  Sonographer Comments:  Technically difficult study due to poor echo windows and no apical window. IMPRESSIONS  1. Poor study quality. No apical views and limited images in other views.  2. Left ventricle incompletely visualized. Normal systolic function, LVEF 55-60% based on limited parasternal views. Wall motion analysis cannot be assessed.  3. Right ventrile not well visualized. Appears dilated with reduced function based on very limited views.  4. The mitral valve was not well visualized. No evidence of mitral valve regurgitation.  5. The aortic valve was not well visualized. Aortic valve regurgitation is not visualized. FINDINGS  Left Ventricle: Left ventricle incompletely visualized. Normal systolic function, LVEF 55-60% based on limited parasternal views. Wall motion analysis cannot be assessed. Right Ventricle: Right ventrile not well visualized. Appears dilated with reduced function based on very limited views. Left Atrium: Left atrial size was not well visualized. Right Atrium: Right atrial size was not well visualized. Pericardium: There is no evidence of pericardial effusion. Mitral Valve: The mitral valve was not well visualized. No evidence of mitral valve regurgitation. Tricuspid Valve: The  tricuspid valve is not well visualized. Tricuspid valve regurgitation is trivial. Aortic Valve: The aortic valve was not well visualized. Aortic valve regurgitation is not visualized. Pulmonic Valve: The pulmonic valve was not well visualized. Pulmonic valve regurgitation is not visualized. Aorta: The aortic root is normal in size and structure. IAS/Shunts: The interatrial septum was not well visualized.  LEFT VENTRICLE PLAX 2D LVIDd:         4.00 cm LVIDs:         3.10 cm LV PW:         1.10 cm LV IVS:        1.50 cm LVOT diam:     2.20 cm LVOT Area:     3.80 cm  IVC IVC diam: 1.80 cm LEFT ATRIUM         Index LA diam:    3.80 cm 1.82 cm/m                        PULMONIC VALVE AORTA                 PV Vmax:        0.63 m/s Ao Root  diam: 3.20 cm PV Vmean:       43.600 cm/s                       PV VTI:         0.096 m                       PV Peak grad:   1.6 mmHg                       PV Mean grad:   1.0 mmHg                       RVOT Peak grad: 1 mmHg   SHUNTS Systemic Diam: 2.20 cm Pulmonic VTI:  0.095 m Keller Paterson Electronically signed by Keller Paterson Signature Date/Time: 11/13/2024/5:27:57 PM    Final    CT Angio Chest Pulmonary Embolism (PE) W or WO Contrast Result Date: 11/13/2024 EXAM: CTA CHEST 11/13/2024 08:15:00 AM TECHNIQUE: CTA of the chest was performed without and with the administration of 75 mL of iohexol  (OMNIPAQUE ) 350 MG/ML injection. Multiplanar reformatted images are provided for review. MIP images are provided for review. Automated exposure control, iterative reconstruction, and/or weight based adjustment of the mA/kV was utilized to reduce the radiation dose to as low as reasonably achievable. COMPARISON: None available. CLINICAL HISTORY: Pulmonary embolism (PE) suspected, high prob. FINDINGS: PULMONARY ARTERIES: Pulmonary arteries are adequately opacified for evaluation. No acute pulmonary embolus. Main pulmonary artery is normal in caliber. MEDIASTINUM: The heart and mediastinal structures are shifted to the right. The heart and pericardium demonstrate no acute abnormality. There is no acute abnormality of the thoracic aorta. LYMPH NODES: No mediastinal, hilar or axillary lymphadenopathy. LUNGS AND PLEURA: The patient is status post right pneumonectomy. There are numerous irregular patchy reticular opacities present within the periphery of the left lung. There is mild central lobular emphysema. No evidence of pleural effusion or pneumothorax. UPPER ABDOMEN: Limited images of the upper abdomen demonstrate stones independently within the gallbladder. 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  No acute soft tissue abnormality. IMPRESSION: 1. No evidence of pulmonary embolus. 2. 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 3. Numerous irregular patchy reticular opacities in the periphery of the left lung. 4. Mild central lobular emphysema. Pulmonary emphysema is an independent risk factor for lung cancer. Recommend consideration for evaluation for a low-dose CT lung cancer screening program. 5. Cholelithiasis. Electronically signed by: Evalene Coho MD 11/13/2024 08:39 AM EST RP Workstation: [id number]   DG Chest Port 1 View Result Date: 11/13/2024 EXAM: 1 VIEW(S) XRAY OF THE CHEST 11/13/2024 02:37:50 AM COMPARISON: 11/12/2024 CLINICAL HISTORY: Shortness of breath FINDINGS: LUNGS AND PLEURA: Right pneumonectomy. Complete opacification of right hemithorax. HEART AND MEDIASTINUM: Rightward mediastinal shift, unchanged. BONES AND SOFT TISSUES: No acute osseous abnormality. IMPRESSION: 1. Right pneumonectomy with complete opacification of the right hemithorax. 2. No acute findings. Electronically signed by: Dorethia Molt MD 11/13/2024 03:36 AM EST RP Workstation: [id number]   DG Chest Port 1 View Result Date: 11/12/2024 CLINICAL DATA:  Dyspnea. EXAM: PORTABLE CHEST 1 VIEW COMPARISON:  11/07/2024 FINDINGS: Complete opacification of the right hemithorax with rightward mediastinal shift, unchanged from prior exam. Peribronchial thickening in the left lung with subsegmental scarring in the periphery of the midlung zone. No confluent opacity. No pneumothorax or significant left pleural effusion. IMPRESSION: 1. Complete opacification of the right hemithorax with rightward mediastinal shift, consistent with prior  pneumonectomy. 2. Peribronchial thickening in the left lung with subsegmental scarring in the periphery of the midlung zone. Electronically Signed   By: Andrea Gasman M.D.   On: 11/12/2024 18:50   DG Chest Port 1 View Result Date: 11/07/2024 CLINICAL DATA:  Shortness of breath. Status post RIGHT pneumonectomy 5 years ago. EXAM: PORTABLE CHEST - 1 VIEW COMPARISON:  02/04/2016 FINDINGS: Complete opacification of the RIGHT hemithorax with RIGHT mediastinal shift consistent with provided history of a total pneumonectomy. Mild scattered atelectasis of the LEFT lung without focal airspace opacity indicative pneumonia. IMPRESSION: 1. Complete opacification of the RIGHT hemithorax consistent with provided history of total pneumonectomy. 2. No acute cardiopulmonary process. Electronically Signed   By: Aliene Lloyd M.D.   On: 11/07/2024 16:16    There are no new results to review at this time.  Previous records (including but not limited to H&P, progress notes, nursing notes, TOC management) were reviewed in assessment of this patient.  Labs: CBC: Recent Labs  Lab 11/15/24 0342 11/19/24 0409  WBC 10.6* 8.1  HGB 11.1* 10.4*  HCT 32.5* 32.4*  MCV 86.9 91.0  PLT 154 130*   Basic Metabolic Panel: Recent Labs  Lab 11/14/24 2023 11/15/24 0342 11/19/24 0409 11/21/24 0440  NA 141  --  136  --   K 4.6  --  4.3  --   CL 97*  --  100  --   CO2 37*  --  31  --   GLUCOSE 122*  --  102*  --   BUN 35*  --  28*  --   CREATININE 0.80  --  0.76 0.80  CALCIUM  8.5*  --  7.9*  --   MG  --  2.2 2.0  --   PHOS  --  3.1  --   --    Liver Function Tests: No results for input(s): AST, ALT, ALKPHOS, BILITOT, PROT, ALBUMIN in the last 168 hours. CBG: Recent Labs  Lab 11/15/24 1802 11/16/24 0929 11/16/24 1240 11/16/24 2027 11/21/24 0455  GLUCAP 123* 120* 115* 122* 135*    Scheduled Meds:  atorvastatin   40 mg Oral Daily   budesonide  (PULMICORT ) nebulizer solution  0.5 mg Nebulization  BID   carvedilol   12.5 mg Oral BID WC   Chlorhexidine  Gluconate Cloth  6 each Topical Daily   enoxaparin  (LOVENOX ) injection  40 mg Subcutaneous Q24H   famotidine   20 mg Oral Daily   ipratropium-albuterol   3 mL Nebulization BID   lidocaine   1 patch Transdermal Q24H   polyethylene glycol  17 g Oral Daily   Continuous Infusions: PRN Meds:.acetaminophen  **OR** acetaminophen , albuterol , ALPRAZolam , bisacodyl , cyclobenzaprine , guaiFENesin , hydrOXYzine , menthol , ondansetron  **OR** ondansetron  (ZOFRAN ) IV, oxyCODONE , traZODone   Family Communication: None at bedside  Disposition: Status is: Inpatient Remains inpatient appropriate because: Dispo     Time spent: 31 minutes  Length of inpatient stay: 14 days  Author: Carliss LELON Canales, DO 11/21/2024 9:27 AM  For on call review [email or web address].   "

## 2024-11-22 DIAGNOSIS — J09X1 Influenza due to identified novel influenza A virus with pneumonia: Secondary | ICD-10-CM | POA: Diagnosis not present

## 2024-11-22 DIAGNOSIS — J9621 Acute and chronic respiratory failure with hypoxia: Secondary | ICD-10-CM | POA: Diagnosis not present

## 2024-11-22 LAB — GLUCOSE, CAPILLARY: Glucose-Capillary: 106 mg/dL — ABNORMAL HIGH (ref 70–99)

## 2024-11-22 NOTE — Plan of Care (Signed)
  Problem: Fluid Volume: Goal: Hemodynamic stability will improve Outcome: Progressing   Problem: Clinical Measurements: Goal: Diagnostic test results will improve Outcome: Progressing   Problem: Clinical Measurements: Goal: Signs and symptoms of infection will decrease Outcome: Progressing

## 2024-11-22 NOTE — Progress Notes (Signed)
 PT Cancellation Note  Patient Details Name: Geoffrey Walker MRN: [id number] DOB: Apr 02, 1961   Cancelled Treatment:    Reason Eval/Treat Not Completed: Patient declined, no reason specified Pt declined after max encouragement.    Stevana Dufner A Ioannis Schuh 11/22/2024, 12:14 PM

## 2024-11-22 NOTE — Progress Notes (Signed)
 " Progress Note   Patient: Geoffrey Walker FMW:[id number] DOB: 03-Sep-1961 DOA: 11/07/2024  DOS: the patient was seen and examined on 11/22/2024   Brief hospital course:  64 y.o. male with medical history significant of lung cancer status post L pneumonectomy, COPD, chronic hypoxemic respiratory failure in the setting of COPD on 2 L at baseline, hypertension, chronic venous stasis, tobacco dependence presented with worsening shortness of breath since 1 week.    Assessment and Plan:   Concern for sepsis (POA) - Lactic acidosis, leukocytosis, worsening hypoxia, influenza A on presentation given concern for sepsis.  Appears to be resolved at this time.  Blood pressure stable.   Acute on chronic hypoxic respiratory failure - Initially requiring BiPAP.  Able to be weaned down to 4 L nasal cannula.  Patient's baseline 2-5 L nasal cannula.  He titrates O2 based on his perceived shortness of breath.  O2 goal 88-92.   Acute influenza A with concern for aspiration versus HAP - Rapid screen positive.  Out of the window for Tamiflu .  Appears to be resolving, back to baseline O2.  Cough improved.  Completed Empiric cefepime  and azithromycin .  Flutter valve, incentive spirometer.  O2 at baseline.  Chest PT.   Elevated troponin - Minimally elevated and flat suggesting demand ischemia.   Hypertension - Currently on Coreg .   Tobacco abuse - Reports that he smokes 1-2 cigarettes/day.  Recommend cessation.   Hypokalemia/hypophosphatemia - Replenishment on board.  Will recheck BMP in AM.   Physical debilitation muscle weakness - Evaluated by PT/OT.  Recommending SNF.  Encouraging patient to work with PT.  Working closely with TOC on disposition planning.  Patient likely to transition to [facility] pending bed availability.   Subjective: Patient resting comfortably this morning.  Denies any fever, chest pain, nausea, vomiting, abdominal pain.  Encouraging patient to work with PT.  Physical  Exam:  Vitals:   11/21/24 2247 11/22/24 0544 11/22/24 0810 11/22/24 0812  BP: (!) 90/45 (!) 113/37  109/66  Pulse: 77 73  80  Resp:  20  16  Temp:  97.7 F (36.5 C)    TempSrc:      SpO2:  98% 100% 100%  Weight:      Height:        GENERAL:  Alert, pleasant, no acute distress, disheveled HEENT:  EOMI, nasal cannula CARDIOVASCULAR:  RRR, no murmurs appreciated RESPIRATORY: poor air movement bilaterally GASTROINTESTINAL:  Soft, nontender, nondistended EXTREMITIES:  No LE edema bilaterally NEURO:  No new focal deficits appreciated SKIN:  No rashes noted PSYCH:  Appropriate mood and affect    Data Reviewed:  Imaging Studies: MR BRAIN W WO CONTRAST Result Date: 11/15/2024 EXAM: MRI BRAIN WITH AND WITHOUT CONTRAST 11/15/2024 11:46:43 AM TECHNIQUE: Multiplanar multisequence MRI of the head/brain was performed with and without the administration of 9 mL gadobutrol  (GADAVIST ) 1 MMOL/ML IV SOLN. COMPARISON: None available. CLINICAL HISTORY: Palate weakness (CN 9) and possible aspiration. History of lung cancer. FINDINGS: BRAIN AND VENTRICLES: No acute infarct. No acute intracranial hemorrhage. No mass effect or midline shift. No hydrocephalus. Ventricular and scattered subcortical T2 hyperintensities are moderately advanced for age. Mild generalized atrophy is present. The sella is unremarkable. Normal flow voids. No mass or abnormal enhancement. ORBITS: No acute abnormality. SINUSES: Mild mucosal thickening is present in the inferior maxillary sinuses bilaterally. BONES AND SOFT TISSUES: Normal bone marrow signal and enhancement. Bilateral mastoid effusions are present. IMPRESSION: 1. No acute intracranial abnormality. 2. Moderately advanced ventricular and scattered subcortical T2 hyperintensities  for age. Electronically signed by: Lonni Necessary MD 11/15/2024 12:01 PM EST RP Workstation: [id number]   DG Chest Port 1 View Result Date: 11/15/2024 EXAM: 1 VIEW(S) XRAY OF THE CHEST  11/15/2024 05:09:00 AM COMPARISON: CXR 11/13/2024 and CT angio chest 11/13/2024. CLINICAL HISTORY: Shortness of breath. FINDINGS: LUNGS AND PLEURA: Right pneumonectomy. Right hemithorax opacification with rightward mediastinal shift, unchanged and consistent with postsurgical changes. Surgical clips are present over the right hilum. No focal pulmonary opacity is seen in the left lung. No pleural effusion or pneumothorax is identified in the left hemithorax. HEART AND MEDIASTINUM: The mediastinal silhouette is shifted rightward due to postsurgical changes. BONES AND SOFT TISSUES: Redemonstration of widening of the seventh and eighth ribs in the setting of known intrathoracic right posterolateral chest wall herniation, best seen on CT angio chest 11/13/2024. IMPRESSION: 1. Unchanged right pneumonectomy with right hemithorax opacification and rightward mediastinal shift, consistent with postsurgical changes. 2. Widening of the seventh and eighth ribs in the setting of known intrathoracic right posterolateral chest wall herniation , best seen on CT angio chest 11/13/2024. Electronically signed by: Morgane Naveau MD 11/15/2024 08:19 AM EST RP Workstation: [id number]   ECHOCARDIOGRAM COMPLETE Result Date: 11/13/2024    ECHOCARDIOGRAM REPORT   Patient Name:   Geoffrey Walker Date of Exam: 11/13/2024 Medical Rec #:  [id number]     Height:       70.0 in Accession #:    [id number]    Weight:       200.6 lb Date of Birth:  1961/06/03     BSA:          2.090 m Patient Age:    63 years      BP:           144/100 mmHg Patient Gender: M             HR:           87 bpm. Exam Location:  [facility] Procedure: 2D Echo, Cardiac Doppler and Color Doppler (Both Spectral and Color            Flow Doppler were utilized during procedure). Indications:     Dyspnea R06.00  History:         Patient has no prior history of Echocardiogram examinations.                  Signs/Symptoms:Dyspnea.  Sonographer:     Rosina Dunk Referring Phys:   [id number] DELAYNE LULLA SOLIAN Diagnosing Phys: Keller Paterson  Sonographer Comments: Technically difficult study due to poor echo windows and no apical window. IMPRESSIONS  1. Poor study quality. No apical views and limited images in other views.  2. Left ventricle incompletely visualized. Normal systolic function, LVEF 55-60% based on limited parasternal views. Wall motion analysis cannot be assessed.  3. Right ventrile not well visualized. Appears dilated with reduced function based on very limited views.  4. The mitral valve was not well visualized. No evidence of mitral valve regurgitation.  5. The aortic valve was not well visualized. Aortic valve regurgitation is not visualized. FINDINGS  Left Ventricle: Left ventricle incompletely visualized. Normal systolic function, LVEF 55-60% based on limited parasternal views. Wall motion analysis cannot be assessed. Right Ventricle: Right ventrile not well visualized. Appears dilated with reduced function based on very limited views. Left Atrium: Left atrial size was not well visualized. Right Atrium: Right atrial size was not well visualized. Pericardium: There is no evidence of pericardial effusion. Mitral Valve: The mitral valve was  not well visualized. No evidence of mitral valve regurgitation. Tricuspid Valve: The tricuspid valve is not well visualized. Tricuspid valve regurgitation is trivial. Aortic Valve: The aortic valve was not well visualized. Aortic valve regurgitation is not visualized. Pulmonic Valve: The pulmonic valve was not well visualized. Pulmonic valve regurgitation is not visualized. Aorta: The aortic root is normal in size and structure. IAS/Shunts: The interatrial septum was not well visualized.  LEFT VENTRICLE PLAX 2D LVIDd:         4.00 cm LVIDs:         3.10 cm LV PW:         1.10 cm LV IVS:        1.50 cm LVOT diam:     2.20 cm LVOT Area:     3.80 cm  IVC IVC diam: 1.80 cm LEFT ATRIUM         Index LA diam:    3.80 cm 1.82 cm/m                         PULMONIC VALVE AORTA                 PV Vmax:        0.63 m/s Ao Root diam: 3.20 cm PV Vmean:       43.600 cm/s                       PV VTI:         0.096 m                       PV Peak grad:   1.6 mmHg                       PV Mean grad:   1.0 mmHg                       RVOT Peak grad: 1 mmHg   SHUNTS Systemic Diam: 2.20 cm Pulmonic VTI:  0.095 m Keller Paterson Electronically signed by Keller Paterson Signature Date/Time: 11/13/2024/5:27:57 PM    Final    CT Angio Chest Pulmonary Embolism (PE) W or WO Contrast Result Date: 11/13/2024 EXAM: CTA CHEST 11/13/2024 08:15:00 AM TECHNIQUE: CTA of the chest was performed without and with the administration of 75 mL of iohexol  (OMNIPAQUE ) 350 MG/ML injection. Multiplanar reformatted images are provided for review. MIP images are provided for review. Automated exposure control, iterative reconstruction, and/or weight based adjustment of the mA/kV was utilized to reduce the radiation dose to as low as reasonably achievable. COMPARISON: None available. CLINICAL HISTORY: Pulmonary embolism (PE) suspected, high prob. FINDINGS: PULMONARY ARTERIES: Pulmonary arteries are adequately opacified for evaluation. No acute pulmonary embolus. Main pulmonary artery is normal in caliber. MEDIASTINUM: The heart and mediastinal structures are shifted to the right. The heart and pericardium demonstrate no acute abnormality. There is no acute abnormality of the thoracic aorta. LYMPH NODES: No mediastinal, hilar or axillary lymphadenopathy. LUNGS AND PLEURA: The patient is status post right pneumonectomy. There are numerous irregular patchy reticular opacities present within the periphery of the left lung. There is mild central lobular emphysema. No evidence of pleural effusion or pneumothorax. UPPER ABDOMEN: Limited images of the upper abdomen demonstrate stones independently within the gallbladder. 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 No acute soft tissue abnormality. IMPRESSION: 1. No evidence of pulmonary embolus. 2. 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 3. Numerous irregular patchy reticular opacities in the periphery of the left lung. 4. Mild central lobular emphysema. Pulmonary emphysema is an independent risk factor for lung cancer. Recommend consideration for evaluation for a low-dose CT lung cancer screening program. 5. Cholelithiasis. Electronically signed by: Evalene Coho MD 11/13/2024 08:39 AM EST RP Workstation: [id number]   DG Chest Port 1 View Result Date: 11/13/2024 EXAM: 1 VIEW(S) XRAY OF THE CHEST 11/13/2024 02:37:50 AM COMPARISON: 11/12/2024 CLINICAL HISTORY: Shortness of breath FINDINGS: LUNGS AND PLEURA: Right pneumonectomy. Complete opacification of right hemithorax. HEART AND MEDIASTINUM: Rightward mediastinal shift, unchanged. BONES AND SOFT TISSUES: No acute osseous abnormality. IMPRESSION: 1. Right pneumonectomy with complete opacification of the right hemithorax. 2. No acute findings. Electronically signed by: Dorethia Molt MD 11/13/2024 03:36 AM EST RP Workstation: [id number]   DG Chest Port 1 View Result Date: 11/12/2024 CLINICAL DATA:  Dyspnea. EXAM: PORTABLE CHEST 1 VIEW COMPARISON:  11/07/2024 FINDINGS: Complete opacification of the right hemithorax with rightward mediastinal shift, unchanged from prior exam. Peribronchial thickening in the left lung with subsegmental scarring in the periphery of the midlung zone. No confluent opacity. No pneumothorax or significant left pleural effusion. IMPRESSION: 1. Complete opacification of the right  hemithorax with rightward mediastinal shift, consistent with prior pneumonectomy. 2. Peribronchial thickening in the left lung with subsegmental scarring in the periphery of the midlung zone. Electronically Signed   By: Andrea Gasman M.D.   On: 11/12/2024 18:50   DG Chest Port 1 View Result Date: 11/07/2024 CLINICAL DATA:  Shortness of breath. Status post RIGHT pneumonectomy 5 years ago. EXAM: PORTABLE CHEST - 1 VIEW COMPARISON:  02/04/2016 FINDINGS: Complete opacification of the RIGHT hemithorax with RIGHT mediastinal shift consistent with provided history of a total pneumonectomy. Mild scattered atelectasis of the LEFT lung without focal airspace opacity indicative pneumonia. IMPRESSION: 1. Complete opacification of the RIGHT hemithorax consistent with provided history of total pneumonectomy. 2. No acute cardiopulmonary process. Electronically Signed   By: Aliene Lloyd M.D.   On: 11/07/2024 16:16    There are no new results to review at this time.  Previous records (including but not limited to H&P, progress notes, nursing notes, TOC management) were reviewed in assessment of this patient.  Labs: CBC: Recent Labs  Lab 11/19/24 0409  WBC 8.1  HGB 10.4*  HCT 32.4*  MCV 91.0  PLT 130*   Basic Metabolic Panel: Recent Labs  Lab 11/19/24 0409 11/21/24 0440  NA 136  --   K 4.3  --   CL 100  --   CO2 31  --   GLUCOSE 102*  --   BUN 28*  --   CREATININE 0.76 0.80  CALCIUM  7.9*  --   MG 2.0  --    Liver Function Tests: No results for input(s): AST, ALT, ALKPHOS, BILITOT, PROT, ALBUMIN in the last 168 hours. CBG: Recent Labs  Lab 11/16/24 0929 11/16/24 1240 11/16/24 2027 11/21/24 0455 11/22/24 0545  GLUCAP 120* 115* 122* 135* 106*    Scheduled Meds:  atorvastatin   40 mg Oral Daily   budesonide  (PULMICORT ) nebulizer solution  0.5 mg Nebulization BID   carvedilol   12.5 mg  Oral BID WC   enoxaparin  (LOVENOX ) injection  40 mg Subcutaneous Q24H   famotidine   20  mg Oral Daily   ipratropium-albuterol   3 mL Nebulization BID   lidocaine   1 patch Transdermal Q24H   polyethylene glycol  17 g Oral Daily   Continuous Infusions: PRN Meds:.acetaminophen  **OR** acetaminophen , albuterol , ALPRAZolam , bisacodyl , cyclobenzaprine , guaiFENesin , hydrOXYzine , menthol , ondansetron  **OR** ondansetron  (ZOFRAN ) IV, oxyCODONE , traZODone   Family Communication: None at bedside  Disposition: Status is: Inpatient Remains inpatient appropriate because: Dispo     Time spent: 30 minutes  Length of inpatient stay: 15 days  Author: Carliss LELON Canales, DO 11/22/2024 11:15 AM  For on call review [email or web address].   "

## 2024-11-22 NOTE — Plan of Care (Signed)

## 2024-11-23 DIAGNOSIS — A419 Sepsis, unspecified organism: Secondary | ICD-10-CM | POA: Diagnosis not present

## 2024-11-23 DIAGNOSIS — R7989 Other specified abnormal findings of blood chemistry: Secondary | ICD-10-CM | POA: Diagnosis not present

## 2024-11-23 DIAGNOSIS — J9621 Acute and chronic respiratory failure with hypoxia: Secondary | ICD-10-CM | POA: Diagnosis not present

## 2024-11-23 DIAGNOSIS — J09X1 Influenza due to identified novel influenza A virus with pneumonia: Secondary | ICD-10-CM | POA: Diagnosis not present

## 2024-11-23 DIAGNOSIS — E876 Hypokalemia: Secondary | ICD-10-CM | POA: Diagnosis not present

## 2024-11-23 LAB — GLUCOSE, CAPILLARY
Glucose-Capillary: 100 mg/dL — ABNORMAL HIGH (ref 70–99)
Glucose-Capillary: 93 mg/dL (ref 70–99)

## 2024-11-23 NOTE — Evaluation (Signed)
 Occupational Therapy Re-Evaluation Patient Details Name: Geoffrey Walker MRN: [id number] DOB: May 07, 1961 Today's Date: 11/23/2024   History of present illness Pt is an 64 y/o M with PMH: BPH, CAD, CABG, PCI & stent, GERD, HTN, dyslipidemia, PVD, OA, MI, R BBB, urinary incontinence. Pt admitted on 11/11/24 after presenting with c/o dyspnea with associated mid sternal chest pain worsening with cough. Pt is being treated for aspiration PNA. Stay notable for choking event evening of 1/1 that may have resulted in aspiration requiring transfer to stepdown unit.   OT comments  Geoffrey Walker was seen for OT re-evaluation on this date following aspiration event requiring stepdown transfer. Upon arrival to room pt in bed, agreeable to tx with encouragement. Pt requires MIN A exit bed using rails, increased time to perform, pt requires rest break citing SOB despite SpO2 100% on 3.5L [place]. MAX A for LB access in sitting, citing back pain. MIN A + HHA sit<>stand, +2 for safety. SpO2 91-100% on 3.5L [place] t/o session - pt reports breathing limiting participation. Educated on importance of OOB daily and HEP, reviewed bed level HEP. Pt making limited progress toward goals, will continue to follow POC. Discharge recommendation remains appropriate.       If plan is discharge home, recommend the following:  A little help with walking and/or transfers;A little help with bathing/dressing/bathroom;Supervision due to cognitive status;Assistance with cooking/housework   Equipment Recommendations  None recommended by OT    Recommendations for Other Services      Precautions / Restrictions Precautions Precautions: Fall Recall of Precautions/Restrictions: Intact Restrictions Weight Bearing Restrictions Per Provider Order: No       Mobility Bed Mobility Overal bed mobility: Needs Assistance Bed Mobility: Supine to Sit, Sit to Supine     Supine to sit: Min assist, HOB elevated, Used rails Sit to supine: Contact guard  assist        Transfers Overall transfer level: Needs assistance Equipment used: 2 person hand held assist Transfers: Sit to/from Stand Sit to Stand: Min assist, +2 safety/equipment           General transfer comment: question effort     Balance Overall balance assessment: Needs assistance Sitting-balance support: No upper extremity supported, Feet supported Sitting balance-[name]: Fair     Standing balance support: Bilateral upper extremity supported Standing balance-[name]: Poor                             ADL either performed or assessed with clinical judgement   ADL Overall ADL's : Needs assistance/impaired                                       General ADL Comments: MAX A for LB access in sitting, citing back pain.     Extremity/Trunk Assessment Upper Extremity Assessment Upper Extremity Assessment: Generalized weakness   Lower Extremity Assessment Lower Extremity Assessment: Generalized weakness        Vision       Perception     Praxis     Communication Communication Communication: Impaired Factors Affecting Communication: Hearing impaired   Cognition Arousal: Alert Behavior During Therapy: Anxious Cognition: No family/caregiver present to determine baseline             OT - Cognition Comments: ?hard of hearing vs cognitive deficits  Following commands: Impaired Following commands impaired: Follows one step commands with increased time      Cueing   Cueing Techniques: Verbal cues  Exercises      Shoulder Instructions       General Comments SpO2 91-100% on 3.5L [place]    Pertinent Vitals/ Pain       Pain Assessment Pain Assessment: 0-10 Pain Score: 9  Pain Location: back Pain Descriptors / Indicators: Aching Pain Intervention(s): Limited activity within patient's tolerance, Patient requesting pain meds-RN notified   Frequency  Min 2X/week        Progress  Toward Goals  OT Goals(current goals can now be found in the care plan section)  Progress towards OT goals: Progressing toward goals  Acute Rehab OT Goals OT Goal Formulation: With patient Time For Goal Achievement: 12/07/24 Potential to Achieve Goals: Fair ADL Goals Pt Will Perform Grooming: with modified independence;standing Pt Will Perform Lower Body Dressing: with modified independence;sit to/from stand Pt Will Transfer to Toilet: with modified independence;ambulating;regular height toilet Pt Will Perform Toileting - Clothing Manipulation and hygiene: with modified independence;sit to/from stand  Plan      Co-evaluation    PT/OT/SLP Co-Evaluation/Treatment: Yes Reason for Co-Treatment: For patient/therapist safety;To address functional/ADL transfers;Necessary to address cognition/behavior during functional activity PT goals addressed during session: Mobility/safety with mobility OT goals addressed during session: ADL's and self-care      AM-PAC OT 6 Clicks Daily Activity     Outcome Measure   Help from another person eating meals?: None Help from another person taking care of personal grooming?: A Little Help from another person toileting, which includes using toliet, bedpan, or urinal?: A Lot Help from another person bathing (including washing, rinsing, drying)?: A Lot Help from another person to put on and taking off regular upper body clothing?: A Little Help from another person to put on and taking off regular lower body clothing?: A Lot 6 Click Score: 16    End of Session Equipment Utilized During Treatment: Oxygen  OT Visit Diagnosis: Unsteadiness on feet (R26.81);Muscle weakness (generalized) (M62.81);Other abnormalities of gait and mobility (R26.89)   Activity Tolerance Patient tolerated treatment well   Patient Left in bed;with call bell/phone within reach;with bed alarm set   Nurse Communication Patient requests pain meds        Time: 8693-8676 OT  Time Calculation (min): 17 min  Charges: OT General Charges $OT Visit: 1 Visit OT Evaluation $OT Re-eval: 1 Re-eval  Geoffrey Walker, M.S. OTR/L  11/23/2024, 1:42 PM  ascom [phone]

## 2024-11-23 NOTE — Plan of Care (Signed)

## 2024-11-23 NOTE — TOC Progression Note (Signed)
 Transition of Care [facility]) - Progression Note    Patient Details  Name: Micholas Drumwright MRN: [id number] Date of Birth: 10-15-1961  [facility] CM/SW Contact  [name] Hamilton, RN Phone Number: 11/23/2024, 4:26 PM  Clinical Narrative:     This CM received notification from [facility] that they would no longer be able to offer patient a bed at this time due to the inability to get insurance authorization.   This CM received a v/m from [facility] representative Zelvia stating no authorization request has been received.   This CM attempted to contact [facility] to discuss patient as he was originally going to go there. Lvmm requesting [facility] return my call.                    Expected Discharge Plan and Services                                               Social Drivers of Health (SDOH) Interventions SDOH Screenings   Food Insecurity: No Food Insecurity (11/09/2024)  Housing: Low Risk (11/09/2024)  Transportation Needs: No Transportation Needs (11/09/2024)  Utilities: Not At Risk (11/09/2024)  Financial Resource Strain: Low Risk (09/21/2023)   Received from [facility]  Tobacco Use: High Risk (11/07/2024)    Readmission Risk Interventions     No data to display

## 2024-11-23 NOTE — Evaluation (Signed)
 Physical Therapy Evaluation Patient Details Name: Geoffrey Walker MRN: [id number] DOB: [birth year] Today's Date: 11/23/2024  History of Present Illness  Pt is an 64 y/o M with PMH: BPH, CAD, CABG, PCI & stent, GERD, HTN, dyslipidemia, PVD, OA, MI, R BBB, urinary incontinence. Pt admitted on 11/11/24 after presenting with c/o dyspnea with associated mid sternal chest pain worsening with cough. Pt is being treated for aspiration PNA. Stay notable for choking event evening of 1/1 that may have resulted in aspiration requiring transfer to stepdown unit.   Clinical Impression  Patient alert, agreeable to mobility with encouragement, reported 9/10 pain, RN notified. Pt complained of shortness of breath throughout, on 3L and saturation 91-98%. Supine to sit minA with encouragement to maximize participation. Able to sit with fair balance. Sit <> stand with minAx2 handheld assist, and 1 side step towards HOB. Pt able to laterally scoot another foot with supervision. Returned to supine with needs in reach, and educated on importance of continued mobility and a few BLE exercises to perfom outside of PT. The patient would benefit from further skilled PT intervention to continue to progress towards goals.        If plan is discharge home, recommend the following: A lot of help with walking and/or transfers;A lot of help with bathing/dressing/bathroom;Assistance with feeding;Assist for transportation;Assistance with cooking/housework;Direct supervision/assist for medications management;Help with stairs or ramp for entrance;Direct supervision/assist for financial management;Supervision due to cognitive status   Can travel by private vehicle   No    Equipment Recommendations Other (comment) (TBD)  Recommendations for Other Services       Functional Status Assessment Patient has had a recent decline in their functional status and demonstrates the ability to make significant improvements in function in a  reasonable and predictable amount of time.     Precautions / Restrictions Precautions Precautions: Fall Recall of Precautions/Restrictions: Intact Precaution/Restrictions Comments: 2 Restrictions Weight Bearing Restrictions Per Provider Order: No      Mobility  Bed Mobility Overal bed mobility: Needs Assistance Bed Mobility: Supine to Sit, Sit to Supine     Supine to sit: Min assist, HOB elevated, Used rails Sit to supine: Contact guard assist   General bed mobility comments: requested hand held A    Transfers Overall transfer level: Needs assistance Equipment used: 2 person hand held assist Transfers: Sit to/from Stand Sit to Stand: Min assist, +2 safety/equipment                Ambulation/Gait                  Stairs            Wheelchair Mobility     Tilt Bed    Modified Rankin (Stroke Patients Only)       Balance Overall balance assessment: Needs assistance Sitting-balance support: No upper extremity supported, Feet supported Sitting balance-[name]: Fair     Standing balance support: Bilateral upper extremity supported Standing balance-[name]: Poor                               Pertinent Vitals/Pain Pain Assessment Pain Assessment: 0-10 Pain Score: 9  Pain Location: back Pain Descriptors / Indicators: Aching    Home Living Family/patient expects to be discharged to:: Private residence Living Arrangements: Non-relatives/Friends Available Help at Discharge: Friend(s);Available 24 hours/day Type of Home: House Home Access: Stairs to enter Entrance Stairs-Rails: Can reach both Entrance Stairs-Number of Steps:  4   Home Layout: Able to live on main level with bedroom/bathroom Home Equipment: Tub bench;Rollator (4 wheels) Additional Comments: reports he lives in a house with several friends    Prior Function Prior Level of Function : Needs assist             Mobility Comments: uses rollator at all  times ADLs Comments: can manage basic ADLs without A, friends help with IADLS     Extremity/Trunk Assessment   Upper Extremity Assessment Upper Extremity Assessment: Generalized weakness    Lower Extremity Assessment Lower Extremity Assessment: Generalized weakness       Communication   Communication Communication: Impaired Factors Affecting Communication: Hearing impaired    Cognition Arousal: Alert Behavior During Therapy: Anxious   PT - Cognitive impairments: No family/caregiver present to determine baseline, Orientation, Awareness, Attention, Initiation, Memory, Sequencing, Problem solving, Safety/Judgement                       PT - Cognition Comments: oriented to city, self Following commands: Impaired Following commands impaired: Follows one step commands with increased time     Cueing Cueing Techniques: Verbal cues     General Comments General comments (skin integrity, edema, etc.): SpO2 91-100% on 3.5L [place]    Exercises     Assessment/Plan    PT Assessment Patient needs continued PT services  PT Problem List Decreased strength;Decreased coordination;Cardiopulmonary status limiting activity;Decreased range of motion;Decreased cognition;Decreased activity tolerance;Decreased knowledge of use of DME;Decreased balance;Decreased safety awareness;Decreased knowledge of precautions;Decreased mobility       PT Treatment Interventions Therapeutic exercise;DME instruction;Balance training;Neuromuscular re-education;Gait training;Functional mobility training;Cognitive remediation;Stair training;Therapeutic activities;Patient/family education    PT Goals (Current goals can be found in the Care Plan section)  Acute Rehab PT Goals Patient Stated Goal: breathe better PT Goal Formulation: With patient Time For Goal Achievement: 12/07/24 Potential to Achieve Goals: Good    Frequency Min 2X/week     Co-evaluation   Reason for Co-Treatment: For  patient/therapist safety;To address functional/ADL transfers;Necessary to address cognition/behavior during functional activity PT goals addressed during session: Mobility/safety with mobility OT goals addressed during session: ADL's and self-care       AM-PAC PT 6 Clicks Mobility  Outcome Measure Help needed turning from your back to your side while in a flat bed without using bedrails?: None Help needed moving from lying on your back to sitting on the side of a flat bed without using bedrails?: A Little Help needed moving to and from a bed to a chair (including a wheelchair)?: A Little Help needed standing up from a chair using your arms (e.g., wheelchair or bedside chair)?: A Little Help needed to walk in hospital room?: A Lot Help needed climbing 3-5 steps with a railing? : Total 6 Click Score: 16    End of Session Equipment Utilized During Treatment: Oxygen Activity Tolerance: Patient limited by fatigue Patient left: in bed;with bed alarm set   PT Visit Diagnosis: Muscle weakness (generalized) (M62.81);Difficulty in walking, not elsewhere classified (R26.2)    Time: 8693-8675 PT Time Calculation (min) (ACUTE ONLY): 18 min   Charges:   PT Evaluation $PT Re-evaluation: 1 Re-eval   PT General Charges $$ ACUTE PT VISIT: 1 Visit        Doyal Shams PT, DPT 3:12 PM,11/23/2024

## 2024-11-23 NOTE — Progress Notes (Signed)
 " Progress Note   Patient: Geoffrey Walker FMW:[id number] DOB: [birth year] DOA: 11/07/2024  DOS: the patient was seen and examined on 11/23/2024   Brief hospital course:  64 y.o. male with medical history significant of lung cancer status post L pneumonectomy, COPD, chronic hypoxemic respiratory failure in the setting of COPD on 2 L at baseline, hypertension, chronic venous stasis, tobacco dependence presented with worsening shortness of breath since 1 week.    Assessment and Plan:   Concern for sepsis (POA) - Lactic acidosis, leukocytosis, worsening hypoxia, influenza A on presentation given concern for sepsis.  Appears to be resolved at this time.  Blood pressure stable.   Acute on chronic hypoxic respiratory failure - Initially requiring BiPAP.  Able to be weaned down to 4 L nasal cannula.  Patient's baseline 2-5 L nasal cannula.  He titrates O2 based on his perceived shortness of breath.  O2 goal 88-92.   Acute influenza A with concern for aspiration versus HAP - Rapid screen positive.  Out of the window for Tamiflu .  Appears to be resolving, back to baseline O2.  Cough improved.  Completed Empiric cefepime  and azithromycin .  Flutter valve, incentive spirometer.  O2 at baseline.  Chest PT.   Elevated troponin - Minimally elevated and flat suggesting demand ischemia.   Hypertension - Currently on Coreg .   Tobacco abuse - Reports that he smokes 1-2 cigarettes/day.  Recommend cessation.   Hypokalemia/hypophosphatemia - Replenishment on board.  Will recheck BMP in AM.   Physical debilitation muscle weakness - Evaluated by PT/OT.  Recommending SNF.  Encouraging patient to work with PT.  Working closely with TOC on disposition planning.  Patient likely to transition to [facility] pending bed availability.   Subjective: Patient resting comfortably this morning.  Denies any fever, chest pain, nausea, vomiting, abdominal pain.  Encouraging patient to work with PT.  Physical  Exam:  Vitals:   11/23/24 0352 11/23/24 0437 11/23/24 0802 11/23/24 0807  BP: (!) 102/56 114/61 (!) 104/53   Pulse: 74 71 72   Resp: 20 17 16    Temp: 98.4 F (36.9 C) 97.7 F (36.5 C) 98.1 F (36.7 C)   TempSrc: Axillary     SpO2: 96% 91% 99% 95%  Weight:      Height:        GENERAL:  Alert, pleasant, no acute distress, disheveled HEENT:  EOMI, nasal cannula CARDIOVASCULAR:  RRR, no murmurs appreciated RESPIRATORY: poor air movement bilaterally GASTROINTESTINAL:  Soft, nontender, nondistended EXTREMITIES:  No LE edema bilaterally NEURO:  No new focal deficits appreciated SKIN:  No rashes noted PSYCH:  Appropriate mood and affect    Data Reviewed:  Imaging Studies: MR BRAIN W WO CONTRAST Result Date: 11/15/2024 EXAM: MRI BRAIN WITH AND WITHOUT CONTRAST 11/15/2024 11:46:43 AM TECHNIQUE: Multiplanar multisequence MRI of the head/brain was performed with and without the administration of 9 mL gadobutrol  (GADAVIST ) 1 MMOL/ML IV SOLN. COMPARISON: None available. CLINICAL HISTORY: Palate weakness (CN 9) and possible aspiration. History of lung cancer. FINDINGS: BRAIN AND VENTRICLES: No acute infarct. No acute intracranial hemorrhage. No mass effect or midline shift. No hydrocephalus. Ventricular and scattered subcortical T2 hyperintensities are moderately advanced for age. Mild generalized atrophy is present. The sella is unremarkable. Normal flow voids. No mass or abnormal enhancement. ORBITS: No acute abnormality. SINUSES: Mild mucosal thickening is present in the inferior maxillary sinuses bilaterally. BONES AND SOFT TISSUES: Normal bone marrow signal and enhancement. Bilateral mastoid effusions are present. IMPRESSION: 1. No acute intracranial abnormality. 2. Moderately advanced  ventricular and scattered subcortical T2 hyperintensities for age. Electronically signed by: Lonni Necessary MD 11/15/2024 12:01 PM EST RP Workstation: [id number]   DG Chest Port 1 View Result Date:  11/15/2024 EXAM: 1 VIEW(S) XRAY OF THE CHEST 11/15/2024 05:09:00 AM COMPARISON: CXR 11/13/2024 and CT angio chest 11/13/2024. CLINICAL HISTORY: Shortness of breath. FINDINGS: LUNGS AND PLEURA: Right pneumonectomy. Right hemithorax opacification with rightward mediastinal shift, unchanged and consistent with postsurgical changes. Surgical clips are present over the right hilum. No focal pulmonary opacity is seen in the left lung. No pleural effusion or pneumothorax is identified in the left hemithorax. HEART AND MEDIASTINUM: The mediastinal silhouette is shifted rightward due to postsurgical changes. BONES AND SOFT TISSUES: Redemonstration of widening of the seventh and eighth ribs in the setting of known intrathoracic right posterolateral chest wall herniation, best seen on CT angio chest 11/13/2024. IMPRESSION: 1. Unchanged right pneumonectomy with right hemithorax opacification and rightward mediastinal shift, consistent with postsurgical changes. 2. Widening of the seventh and eighth ribs in the setting of known intrathoracic right posterolateral chest wall herniation , best seen on CT angio chest 11/13/2024. Electronically signed by: Morgane Naveau MD 11/15/2024 08:19 AM EST RP Workstation: [id number]   ECHOCARDIOGRAM COMPLETE Result Date: 11/13/2024    ECHOCARDIOGRAM REPORT   Patient Name:   Geoffrey Walker Date of Exam: 11/13/2024 Medical Rec #:  [id number]     Height:       70.0 in Accession #:    [id number]    Weight:       200.6 lb Date of Birth:  06-Sep-1961     BSA:          2.090 m Patient Age:    63 years      BP:           144/100 mmHg Patient Gender: M             HR:           87 bpm. Exam Location:  [facility] Procedure: 2D Echo, Cardiac Doppler and Color Doppler (Both Spectral and Color            Flow Doppler were utilized during procedure). Indications:     Dyspnea R06.00  History:         Patient has no prior history of Echocardiogram examinations.                  Signs/Symptoms:Dyspnea.  Sonographer:      Rosina Dunk Referring Phys:  [id number] DELAYNE LULLA SOLIAN Diagnosing Phys: Keller Paterson  Sonographer Comments: Technically difficult study due to poor echo windows and no apical window. IMPRESSIONS  1. Poor study quality. No apical views and limited images in other views.  2. Left ventricle incompletely visualized. Normal systolic function, LVEF 55-60% based on limited parasternal views. Wall motion analysis cannot be assessed.  3. Right ventrile not well visualized. Appears dilated with reduced function based on very limited views.  4. The mitral valve was not well visualized. No evidence of mitral valve regurgitation.  5. The aortic valve was not well visualized. Aortic valve regurgitation is not visualized. FINDINGS  Left Ventricle: Left ventricle incompletely visualized. Normal systolic function, LVEF 55-60% based on limited parasternal views. Wall motion analysis cannot be assessed. Right Ventricle: Right ventrile not well visualized. Appears dilated with reduced function based on very limited views. Left Atrium: Left atrial size was not well visualized. Right Atrium: Right atrial size was not well visualized. Pericardium: There is no evidence of pericardial effusion.  Mitral Valve: The mitral valve was not well visualized. No evidence of mitral valve regurgitation. Tricuspid Valve: The tricuspid valve is not well visualized. Tricuspid valve regurgitation is trivial. Aortic Valve: The aortic valve was not well visualized. Aortic valve regurgitation is not visualized. Pulmonic Valve: The pulmonic valve was not well visualized. Pulmonic valve regurgitation is not visualized. Aorta: The aortic root is normal in size and structure. IAS/Shunts: The interatrial septum was not well visualized.  LEFT VENTRICLE PLAX 2D LVIDd:         4.00 cm LVIDs:         3.10 cm LV PW:         1.10 cm LV IVS:        1.50 cm LVOT diam:     2.20 cm LVOT Area:     3.80 cm  IVC IVC diam: 1.80 cm LEFT ATRIUM         Index LA diam:     3.80 cm 1.82 cm/m                        PULMONIC VALVE AORTA                 PV Vmax:        0.63 m/s Ao Root diam: 3.20 cm PV Vmean:       43.600 cm/s                       PV VTI:         0.096 m                       PV Peak grad:   1.6 mmHg                       PV Mean grad:   1.0 mmHg                       RVOT Peak grad: 1 mmHg   SHUNTS Systemic Diam: 2.20 cm Pulmonic VTI:  0.095 m Keller Paterson Electronically signed by Keller Paterson Signature Date/Time: 11/13/2024/5:27:57 PM    Final    CT Angio Chest Pulmonary Embolism (PE) W or WO Contrast Result Date: 11/13/2024 EXAM: CTA CHEST 11/13/2024 08:15:00 AM TECHNIQUE: CTA of the chest was performed without and with the administration of 75 mL of iohexol  (OMNIPAQUE ) 350 MG/ML injection. Multiplanar reformatted images are provided for review. MIP images are provided for review. Automated exposure control, iterative reconstruction, and/or weight based adjustment of the mA/kV was utilized to reduce the radiation dose to as low as reasonably achievable. COMPARISON: None available. CLINICAL HISTORY: Pulmonary embolism (PE) suspected, high prob. FINDINGS: PULMONARY ARTERIES: Pulmonary arteries are adequately opacified for evaluation. No acute pulmonary embolus. Main pulmonary artery is normal in caliber. MEDIASTINUM: The heart and mediastinal structures are shifted to the right. The heart and pericardium demonstrate no acute abnormality. There is no acute abnormality of the thoracic aorta. LYMPH NODES: No mediastinal, hilar or axillary lymphadenopathy. LUNGS AND PLEURA: The patient is status post right pneumonectomy. There are numerous irregular patchy reticular opacities present within the periphery of the left lung. There is mild central lobular emphysema. No evidence of pleural effusion or pneumothorax. UPPER ABDOMEN: Limited images of the upper abdomen demonstrate stones independently within the gallbladder. 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 No acute soft tissue abnormality. IMPRESSION: 1. No evidence of pulmonary embolus. 2. 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 3. Numerous irregular patchy reticular opacities in the periphery of the left lung. 4. Mild central lobular emphysema. Pulmonary emphysema is an independent risk factor for lung cancer. Recommend consideration for evaluation for a low-dose CT lung cancer screening program. 5. Cholelithiasis. Electronically signed by: Evalene Coho MD 11/13/2024 08:39 AM EST RP Workstation: [id number]   DG Chest Port 1 View Result Date: 11/13/2024 EXAM: 1 VIEW(S) XRAY OF THE CHEST 11/13/2024 02:37:50 AM COMPARISON: 11/12/2024 CLINICAL HISTORY: Shortness of breath FINDINGS: LUNGS AND PLEURA: Right pneumonectomy. Complete opacification of right hemithorax. HEART AND MEDIASTINUM: Rightward mediastinal shift, unchanged. BONES AND SOFT TISSUES: No acute osseous abnormality. IMPRESSION: 1. Right pneumonectomy with complete opacification of the right hemithorax. 2. No acute findings. Electronically signed by: Dorethia Molt MD 11/13/2024 03:36 AM EST RP Workstation: [id number]   DG Chest Port 1 View Result Date: 11/12/2024 CLINICAL DATA:  Dyspnea. EXAM: PORTABLE CHEST 1 VIEW COMPARISON:  11/07/2024 FINDINGS: Complete opacification of the right hemithorax with rightward mediastinal shift, unchanged from prior exam. Peribronchial thickening in the left lung with subsegmental scarring in the periphery of the midlung zone. No confluent opacity. No pneumothorax or significant left pleural effusion. IMPRESSION: 1.  Complete opacification of the right hemithorax with rightward mediastinal shift, consistent with prior pneumonectomy. 2. Peribronchial thickening in the left lung with subsegmental scarring in the periphery of the midlung zone. Electronically Signed   By: Andrea Gasman M.D.   On: 11/12/2024 18:50   DG Chest Port 1 View Result Date: 11/07/2024 CLINICAL DATA:  Shortness of breath. Status post RIGHT pneumonectomy 5 years ago. EXAM: PORTABLE CHEST - 1 VIEW COMPARISON:  02/04/2016 FINDINGS: Complete opacification of the RIGHT hemithorax with RIGHT mediastinal shift consistent with provided history of a total pneumonectomy. Mild scattered atelectasis of the LEFT lung without focal airspace opacity indicative pneumonia. IMPRESSION: 1. Complete opacification of the RIGHT hemithorax consistent with provided history of total pneumonectomy. 2. No acute cardiopulmonary process. Electronically Signed   By: Aliene Lloyd M.D.   On: 11/07/2024 16:16    There are no new results to review at this time.  Previous records (including but not limited to H&P, progress notes, nursing notes, TOC management) were reviewed in assessment of this patient.  Labs: CBC: Recent Labs  Lab 11/19/24 0409  WBC 8.1  HGB 10.4*  HCT 32.4*  MCV 91.0  PLT 130*   Basic Metabolic Panel: Recent Labs  Lab 11/19/24 0409 11/21/24 0440  NA 136  --   K 4.3  --   CL 100  --   CO2 31  --   GLUCOSE 102*  --   BUN 28*  --   CREATININE 0.76 0.80  CALCIUM  7.9*  --   MG 2.0  --    Liver Function Tests: No results for input(s): AST, ALT, ALKPHOS, BILITOT, PROT, ALBUMIN in the last 168 hours. CBG: Recent Labs  Lab 11/16/24 2027 11/21/24 0455 11/22/24 0545 11/23/24 0612 11/23/24 0819  GLUCAP 122* 135* 106* 100* 93    Scheduled Meds:  atorvastatin   40 mg Oral Daily   budesonide  (PULMICORT ) nebulizer solution  0.5 mg Nebulization BID  carvedilol   12.5 mg Oral BID WC   enoxaparin  (LOVENOX ) injection  40 mg  Subcutaneous Q24H   famotidine   20 mg Oral Daily   ipratropium-albuterol   3 mL Nebulization BID   lidocaine   1 patch Transdermal Q24H   polyethylene glycol  17 g Oral Daily   Continuous Infusions: PRN Meds:.acetaminophen  **OR** acetaminophen , albuterol , ALPRAZolam , bisacodyl , cyclobenzaprine , guaiFENesin , hydrOXYzine , menthol , ondansetron  **OR** ondansetron  (ZOFRAN ) IV, oxyCODONE , traZODone   Family Communication: None at bedside  Disposition: Status is: Inpatient Remains inpatient appropriate because: Dispo     Time spent: 29 minutes  Length of inpatient stay: 16 days  Author: Carliss LELON Canales, DO 11/23/2024 12:01 PM  For on call review [email or web address].   "

## 2024-11-24 DIAGNOSIS — J9621 Acute and chronic respiratory failure with hypoxia: Secondary | ICD-10-CM | POA: Diagnosis not present

## 2024-11-24 DIAGNOSIS — A419 Sepsis, unspecified organism: Secondary | ICD-10-CM | POA: Diagnosis not present

## 2024-11-24 DIAGNOSIS — E876 Hypokalemia: Secondary | ICD-10-CM | POA: Diagnosis not present

## 2024-11-24 DIAGNOSIS — J09X1 Influenza due to identified novel influenza A virus with pneumonia: Secondary | ICD-10-CM | POA: Diagnosis not present

## 2024-11-24 LAB — GLUCOSE, CAPILLARY
Glucose-Capillary: 98 mg/dL (ref 70–99)
Glucose-Capillary: 81 mg/dL (ref 70–99)

## 2024-11-24 LAB — CBC
HCT: 27.5 % — ABNORMAL LOW (ref 39.0–52.0)
Hemoglobin: 8.8 g/dL — ABNORMAL LOW (ref 13.0–17.0)
MCH: 30.3 pg (ref 26.0–34.0)
MCHC: 32 g/dL (ref 30.0–36.0)
MCV: 94.8 fL (ref 80.0–100.0)
Platelets: 125 K/uL — ABNORMAL LOW (ref 150–400)
RBC: 2.9 MIL/uL — ABNORMAL LOW (ref 4.22–5.81)
RDW: 14.6 % (ref 11.5–15.5)
WBC: 6.8 K/uL (ref 4.0–10.5)
nRBC: 0 % (ref 0.0–0.2)

## 2024-11-24 NOTE — Plan of Care (Signed)
   Problem: Nutrition: Goal: Adequate nutrition will be maintained Outcome: Progressing   Problem: Coping: Goal: Level of anxiety will decrease Outcome: Progressing

## 2024-11-24 NOTE — Progress Notes (Signed)
 Mobility Specialist - Progress Note   11/24/24 1418  Mobility  Activity Turned to right side;Turned to left side;Turned to back - supine (bedpan)  Level of Assistance Minimal assist, patient does 75% or more  Assistive Device None  Activity Response Tolerated well  Mobility visit 1 Mobility  Mobility Specialist Start Time (ACUTE ONLY) 1352  Mobility Specialist Stop Time (ACUTE ONLY) 1415  Mobility Specialist Time Calculation (min) (ACUTE ONLY) 23 min   Pt supine upon entry, utilizing RA. Pt refused standing attempts this date, expressing I'm too SOB to walk. He requested use of the bedpan, again denying OOB mobility attempts. Bedpan placed, MaxA for pericare. Pt left semi fowler with alarm set and needs within reach.  America Silvan Mobility Specialist 11/24/2024 2:24 PM

## 2024-11-24 NOTE — Progress Notes (Signed)
 " Progress Note   Patient: Geoffrey Walker FMW:[id number] DOB: 03-14-1961 DOA: 11/07/2024  DOS: the patient was seen and examined on 11/24/2024   Brief hospital course:  64 y.o. male with medical history significant of lung cancer status post L pneumonectomy, COPD, chronic hypoxemic respiratory failure in the setting of COPD on 2 L at baseline, hypertension, chronic venous stasis, tobacco dependence presented with worsening shortness of breath since 1 week.    Assessment and Plan:   Concern for sepsis (POA) - Lactic acidosis, leukocytosis, worsening hypoxia, influenza A on presentation given concern for sepsis.  Appears to be resolved at this time.  Blood pressure stable.   Acute on chronic hypoxic respiratory failure - Initially requiring BiPAP.  Able to be weaned down to 4 L nasal cannula.  Patient's baseline 2-5 L nasal cannula.  He titrates O2 based on his perceived shortness of breath.  O2 goal 88-92.   Acute influenza A with concern for aspiration versus HAP - Rapid screen positive.  Out of the window for Tamiflu .  Appears to be resolving, back to baseline O2.  Cough improved.  Completed Empiric cefepime  and azithromycin .  Flutter valve, incentive spirometer.  O2 at baseline.  Chest PT.   Elevated troponin - Minimally elevated and flat suggesting demand ischemia.   Hypertension - Currently on Coreg .   Tobacco abuse - Reports that he smokes 1-2 cigarettes/day.  Recommend cessation.   Hypokalemia/hypophosphatemia - Replenishment on board.  Will recheck BMP in AM.   Physical debilitation muscle weakness - Evaluated by PT/OT.  Recommending SNF.  Encouraging patient to work with PT.  Working closely with TOC on disposition planning.  Apparently have been delays with facilities and auth.   Subjective: Patient resting comfortably this morning.  Denies any fever, chest pain, nausea, vomiting, abdominal pain.   Physical Exam:  Vitals:   11/23/24 1821 11/23/24 2122 11/24/24 0434  11/24/24 0828  BP: (!) 93/56 (!) 94/58 (!) 94/54 106/65  Pulse: 68 66 70 60  Resp:  18 18 16   Temp:  98.3 F (36.8 C) 98.3 F (36.8 C) 97.6 F (36.4 C)  TempSrc:      SpO2: 97% 98% 98% 100%  Weight:      Height:        GENERAL:  Alert, pleasant, no acute distress, disheveled HEENT:  EOMI, nasal cannula CARDIOVASCULAR:  RRR, no murmurs appreciated RESPIRATORY: poor air movement bilaterally GASTROINTESTINAL:  Soft, nontender, nondistended EXTREMITIES:  No LE edema bilaterally NEURO:  No new focal deficits appreciated SKIN:  No rashes noted PSYCH:  Appropriate mood and affect    Data Reviewed:  Imaging Studies: MR BRAIN W WO CONTRAST Result Date: 11/15/2024 EXAM: MRI BRAIN WITH AND WITHOUT CONTRAST 11/15/2024 11:46:43 AM TECHNIQUE: Multiplanar multisequence MRI of the head/brain was performed with and without the administration of 9 mL gadobutrol  (GADAVIST ) 1 MMOL/ML IV SOLN. COMPARISON: None available. CLINICAL HISTORY: Palate weakness (CN 9) and possible aspiration. History of lung cancer. FINDINGS: BRAIN AND VENTRICLES: No acute infarct. No acute intracranial hemorrhage. No mass effect or midline shift. No hydrocephalus. Ventricular and scattered subcortical T2 hyperintensities are moderately advanced for age. Mild generalized atrophy is present. The sella is unremarkable. Normal flow voids. No mass or abnormal enhancement. ORBITS: No acute abnormality. SINUSES: Mild mucosal thickening is present in the inferior maxillary sinuses bilaterally. BONES AND SOFT TISSUES: Normal bone marrow signal and enhancement. Bilateral mastoid effusions are present. IMPRESSION: 1. No acute intracranial abnormality. 2. Moderately advanced ventricular and scattered subcortical T2 hyperintensities  for age. Electronically signed by: Lonni Necessary MD 11/15/2024 12:01 PM EST RP Workstation: [id number]   DG Chest Port 1 View Result Date: 11/15/2024 EXAM: 1 VIEW(S) XRAY OF THE CHEST 11/15/2024 05:09:00 AM  COMPARISON: CXR 11/13/2024 and CT angio chest 11/13/2024. CLINICAL HISTORY: Shortness of breath. FINDINGS: LUNGS AND PLEURA: Right pneumonectomy. Right hemithorax opacification with rightward mediastinal shift, unchanged and consistent with postsurgical changes. Surgical clips are present over the right hilum. No focal pulmonary opacity is seen in the left lung. No pleural effusion or pneumothorax is identified in the left hemithorax. HEART AND MEDIASTINUM: The mediastinal silhouette is shifted rightward due to postsurgical changes. BONES AND SOFT TISSUES: Redemonstration of widening of the seventh and eighth ribs in the setting of known intrathoracic right posterolateral chest wall herniation, best seen on CT angio chest 11/13/2024. IMPRESSION: 1. Unchanged right pneumonectomy with right hemithorax opacification and rightward mediastinal shift, consistent with postsurgical changes. 2. Widening of the seventh and eighth ribs in the setting of known intrathoracic right posterolateral chest wall herniation , best seen on CT angio chest 11/13/2024. Electronically signed by: Morgane Naveau MD 11/15/2024 08:19 AM EST RP Workstation: [id number]   ECHOCARDIOGRAM COMPLETE Result Date: 11/13/2024    ECHOCARDIOGRAM REPORT   Patient Name:   Geoffrey Walker Date of Exam: 11/13/2024 Medical Rec #:  [id number]     Height:       70.0 in Accession #:    [id number]    Weight:       200.6 lb Date of Birth:  06-29-1961     BSA:          2.090 m Patient Age:    63 years      BP:           144/100 mmHg Patient Gender: M             HR:           87 bpm. Exam Location:  [facility] Procedure: 2D Echo, Cardiac Doppler and Color Doppler (Both Spectral and Color            Flow Doppler were utilized during procedure). Indications:     Dyspnea R06.00  History:         Patient has no prior history of Echocardiogram examinations.                  Signs/Symptoms:Dyspnea.  Sonographer:     Rosina Dunk Referring Phys:  [id number] Geoffrey Walker  Diagnosing Phys: Keller Paterson  Sonographer Comments: Technically difficult study due to poor echo windows and no apical window. IMPRESSIONS  1. Poor study quality. No apical views and limited images in other views.  2. Left ventricle incompletely visualized. Normal systolic function, LVEF 55-60% based on limited parasternal views. Wall motion analysis cannot be assessed.  3. Right ventrile not well visualized. Appears dilated with reduced function based on very limited views.  4. The mitral valve was not well visualized. No evidence of mitral valve regurgitation.  5. The aortic valve was not well visualized. Aortic valve regurgitation is not visualized. FINDINGS  Left Ventricle: Left ventricle incompletely visualized. Normal systolic function, LVEF 55-60% based on limited parasternal views. Wall motion analysis cannot be assessed. Right Ventricle: Right ventrile not well visualized. Appears dilated with reduced function based on very limited views. Left Atrium: Left atrial size was not well visualized. Right Atrium: Right atrial size was not well visualized. Pericardium: There is no evidence of pericardial effusion. Mitral Valve: The mitral valve was  not well visualized. No evidence of mitral valve regurgitation. Tricuspid Valve: The tricuspid valve is not well visualized. Tricuspid valve regurgitation is trivial. Aortic Valve: The aortic valve was not well visualized. Aortic valve regurgitation is not visualized. Pulmonic Valve: The pulmonic valve was not well visualized. Pulmonic valve regurgitation is not visualized. Aorta: The aortic root is normal in size and structure. IAS/Shunts: The interatrial septum was not well visualized.  LEFT VENTRICLE PLAX 2D LVIDd:         4.00 cm LVIDs:         3.10 cm LV PW:         1.10 cm LV IVS:        1.50 cm LVOT diam:     2.20 cm LVOT Area:     3.80 cm  IVC IVC diam: 1.80 cm LEFT ATRIUM         Index LA diam:    3.80 cm 1.82 cm/m                        PULMONIC VALVE  AORTA                 PV Vmax:        0.63 m/s Ao Root diam: 3.20 cm PV Vmean:       43.600 cm/s                       PV VTI:         0.096 m                       PV Peak grad:   1.6 mmHg                       PV Mean grad:   1.0 mmHg                       RVOT Peak grad: 1 mmHg   SHUNTS Systemic Diam: 2.20 cm Pulmonic VTI:  0.095 m Keller Paterson Electronically signed by Keller Paterson Signature Date/Time: 11/13/2024/5:27:57 PM    Final    CT Angio Chest Pulmonary Embolism (PE) W or WO Contrast Result Date: 11/13/2024 EXAM: CTA CHEST 11/13/2024 08:15:00 AM TECHNIQUE: CTA of the chest was performed without and with the administration of 75 mL of iohexol  (OMNIPAQUE ) 350 MG/ML injection. Multiplanar reformatted images are provided for review. MIP images are provided for review. Automated exposure control, iterative reconstruction, and/or weight based adjustment of the mA/kV was utilized to reduce the radiation dose to as low as reasonably achievable. COMPARISON: None available. CLINICAL HISTORY: Pulmonary embolism (PE) suspected, high prob. FINDINGS: PULMONARY ARTERIES: Pulmonary arteries are adequately opacified for evaluation. No acute pulmonary embolus. Main pulmonary artery is normal in caliber. MEDIASTINUM: The heart and mediastinal structures are shifted to the right. The heart and pericardium demonstrate no acute abnormality. There is no acute abnormality of the thoracic aorta. LYMPH NODES: No mediastinal, hilar or axillary lymphadenopathy. LUNGS AND PLEURA: The patient is status post right pneumonectomy. There are numerous irregular patchy reticular opacities present within the periphery of the left lung. There is mild central lobular emphysema. No evidence of pleural effusion or pneumothorax. UPPER ABDOMEN: Limited images of the upper abdomen demonstrate stones independently within the gallbladder. 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 No acute soft tissue abnormality. IMPRESSION: 1. No evidence of pulmonary embolus. 2. 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 3. Numerous irregular patchy reticular opacities in the periphery of the left lung. 4. Mild central lobular emphysema. Pulmonary emphysema is an independent risk factor for lung cancer. Recommend consideration for evaluation for a low-dose CT lung cancer screening program. 5. Cholelithiasis. Electronically signed by: Evalene Coho MD 11/13/2024 08:39 AM EST RP Workstation: [id number]   DG Chest Port 1 View Result Date: 11/13/2024 EXAM: 1 VIEW(S) XRAY OF THE CHEST 11/13/2024 02:37:50 AM COMPARISON: 11/12/2024 CLINICAL HISTORY: Shortness of breath FINDINGS: LUNGS AND PLEURA: Right pneumonectomy. Complete opacification of right hemithorax. HEART AND MEDIASTINUM: Rightward mediastinal shift, unchanged. BONES AND SOFT TISSUES: No acute osseous abnormality. IMPRESSION: 1. Right pneumonectomy with complete opacification of the right hemithorax. 2. No acute findings. Electronically signed by: Dorethia Molt MD 11/13/2024 03:36 AM EST RP Workstation: [id number]   DG Chest Port 1 View Result Date: 11/12/2024 CLINICAL DATA:  Dyspnea. EXAM: PORTABLE CHEST 1 VIEW COMPARISON:  11/07/2024 FINDINGS: Complete opacification of the right hemithorax with rightward mediastinal shift, unchanged from prior exam. Peribronchial thickening in the left lung with subsegmental scarring in the periphery of the midlung zone. No confluent opacity. No pneumothorax or significant left pleural effusion. IMPRESSION: 1. Complete opacification of the right hemithorax with rightward  mediastinal shift, consistent with prior pneumonectomy. 2. Peribronchial thickening in the left lung with subsegmental scarring in the periphery of the midlung zone. Electronically Signed   By: Andrea Gasman M.D.   On: 11/12/2024 18:50   DG Chest Port 1 View Result Date: 11/07/2024 CLINICAL DATA:  Shortness of breath. Status post RIGHT pneumonectomy 5 years ago. EXAM: PORTABLE CHEST - 1 VIEW COMPARISON:  02/04/2016 FINDINGS: Complete opacification of the RIGHT hemithorax with RIGHT mediastinal shift consistent with provided history of a total pneumonectomy. Mild scattered atelectasis of the LEFT lung without focal airspace opacity indicative pneumonia. IMPRESSION: 1. Complete opacification of the RIGHT hemithorax consistent with provided history of total pneumonectomy. 2. No acute cardiopulmonary process. Electronically Signed   By: Aliene Lloyd M.D.   On: 11/07/2024 16:16    There are no new results to review at this time.  Previous records (including but not limited to H&P, progress notes, nursing notes, TOC management) were reviewed in assessment of this patient.  Labs: CBC: Recent Labs  Lab 11/19/24 0409 11/24/24 0513  WBC 8.1 6.8  HGB 10.4* 8.8*  HCT 32.4* 27.5*  MCV 91.0 94.8  PLT 130* 125*   Basic Metabolic Panel: Recent Labs  Lab 11/19/24 0409 11/21/24 0440  NA 136  --   K 4.3  --   CL 100  --   CO2 31  --   GLUCOSE 102*  --   BUN 28*  --   CREATININE 0.76 0.80  CALCIUM  7.9*  --   MG 2.0  --    Liver Function Tests: No results for input(s): AST, ALT, ALKPHOS, BILITOT, PROT, ALBUMIN in the last 168 hours. CBG: Recent Labs  Lab 11/22/24 0545 11/23/24 0612 11/23/24 0819 11/24/24 0442 11/24/24 0823  GLUCAP 106* 100* 93 98 81    Scheduled Meds:  atorvastatin   40 mg Oral Daily   budesonide  (PULMICORT ) nebulizer solution  0.5 mg Nebulization  BID   carvedilol   12.5 mg Oral BID WC   famotidine   20 mg Oral Daily   ipratropium-albuterol   3 mL  Nebulization BID   lidocaine   1 patch Transdermal Q24H   polyethylene glycol  17 g Oral Daily   Continuous Infusions: PRN Meds:.acetaminophen  **OR** acetaminophen , albuterol , ALPRAZolam , bisacodyl , cyclobenzaprine , guaiFENesin , hydrOXYzine , menthol , ondansetron  **OR** ondansetron  (ZOFRAN ) IV, oxyCODONE , traZODone   Family Communication: None at bedside  Disposition: Status is: Inpatient Remains inpatient appropriate because: Dispo     Time spent: 30 minutes  Length of inpatient stay: 17 days  Author: Carliss LELON Canales, DO 11/24/2024 11:16 AM  For on call review [email or web address].   "

## 2024-11-24 NOTE — TOC Progression Note (Addendum)
 Transition of Care [facility]) - Progression Note    Patient Details  Name: Bruk Tumolo MRN: [id number] Date of Birth: 1961-10-26  Transition of Care [facility]) CM/SW Contact  [name] Hamilton, RN Phone Number: 11/24/2024, 9:31 AM  Clinical Narrative:     Spoke with Zelvia, [facility] contact, she verbalized she could not see that a prior authorization had been requested. Zelvia verbalized that if the facility will call her, she will provide them with all required information and walk them through the upload process.  Spoke with Paris with [facility], discussed the above and Paris verbalized she nor her DON have any additional time to spend on this process for insurance prior authorization and that they can no longer offer the patient a bed.  This CM verbalized understanding.  This CM lvmm with Asberry at [facility] to inquire if they are able to offer patient at bed at this time, awaiting call back.   UPDATE:  Spoke with Zelvia at [facility], she advised she had spoken with Paris at [facility] and was able to walk her through the required documentation and also uploading the documentation.   UPDATE 3:15pm:  Spoke with Asberry at [facility] and explained current situation. Asberry stated she will get with Paris and discuss and then call me back.   UPDATE 4:46pm:  Notified by [facility] and Rebecca at [facility], prior authorization request has been received for STR at [facility]. This was sent as urgent request, decision must be made within 3 days.                     Expected Discharge Plan and Services                                               Social Drivers of Health (SDOH) Interventions SDOH Screenings   Food Insecurity: No Food Insecurity (11/09/2024)  Housing: Low Risk (11/09/2024)  Transportation Needs: No Transportation Needs (11/09/2024)  Utilities: Not At Risk (11/09/2024)  Financial Resource Strain: Low Risk (09/21/2023)   Received from [facility]  Tobacco  Use: High Risk (11/07/2024)    Readmission Risk Interventions     No data to display

## 2024-11-24 NOTE — Plan of Care (Signed)

## 2024-11-25 DIAGNOSIS — I5A Non-ischemic myocardial injury (non-traumatic): Secondary | ICD-10-CM

## 2024-11-25 DIAGNOSIS — A419 Sepsis, unspecified organism: Secondary | ICD-10-CM | POA: Diagnosis not present

## 2024-11-25 DIAGNOSIS — K5909 Other constipation: Secondary | ICD-10-CM

## 2024-11-25 DIAGNOSIS — I10 Essential (primary) hypertension: Secondary | ICD-10-CM

## 2024-11-25 DIAGNOSIS — J9621 Acute and chronic respiratory failure with hypoxia: Secondary | ICD-10-CM | POA: Diagnosis not present

## 2024-11-25 DIAGNOSIS — J09X1 Influenza due to identified novel influenza A virus with pneumonia: Secondary | ICD-10-CM | POA: Diagnosis not present

## 2024-11-25 DIAGNOSIS — Z720 Tobacco use: Secondary | ICD-10-CM | POA: Diagnosis not present

## 2024-11-25 DIAGNOSIS — J181 Lobar pneumonia, unspecified organism: Secondary | ICD-10-CM

## 2024-11-25 DIAGNOSIS — D649 Anemia, unspecified: Secondary | ICD-10-CM | POA: Diagnosis not present

## 2024-11-25 DIAGNOSIS — D696 Thrombocytopenia, unspecified: Secondary | ICD-10-CM

## 2024-11-25 DIAGNOSIS — R531 Weakness: Secondary | ICD-10-CM

## 2024-11-25 DIAGNOSIS — R6520 Severe sepsis without septic shock: Secondary | ICD-10-CM

## 2024-11-25 DIAGNOSIS — E878 Other disorders of electrolyte and fluid balance, not elsewhere classified: Secondary | ICD-10-CM

## 2024-11-25 DIAGNOSIS — K5900 Constipation, unspecified: Secondary | ICD-10-CM | POA: Insufficient documentation

## 2024-11-25 LAB — CBC
HCT: 28.4 % — ABNORMAL LOW (ref 39.0–52.0)
Hemoglobin: 8.8 g/dL — ABNORMAL LOW (ref 13.0–17.0)
MCH: 29.2 pg (ref 26.0–34.0)
MCHC: 31 g/dL (ref 30.0–36.0)
MCV: 94.4 fL (ref 80.0–100.0)
Platelets: 118 K/uL — ABNORMAL LOW (ref 150–400)
RBC: 3.01 MIL/uL — ABNORMAL LOW (ref 4.22–5.81)
RDW: 14.9 % (ref 11.5–15.5)
WBC: 7 K/uL (ref 4.0–10.5)
nRBC: 0 % (ref 0.0–0.2)

## 2024-11-25 LAB — BASIC METABOLIC PANEL WITH GFR
Anion gap: 4 — ABNORMAL LOW (ref 5–15)
BUN: 13 mg/dL (ref 8–23)
CO2: 36 mmol/L — ABNORMAL HIGH (ref 22–32)
Calcium: 8.9 mg/dL (ref 8.9–10.3)
Chloride: 98 mmol/L (ref 98–111)
Creatinine, Ser: 0.93 mg/dL (ref 0.61–1.24)
GFR, Estimated: >60 mL/min
Glucose, Bld: 135 mg/dL — ABNORMAL HIGH (ref 70–99)
Potassium: 4.8 mmol/L (ref 3.5–5.1)
Sodium: 138 mmol/L (ref 135–145)

## 2024-11-25 LAB — FERRITIN: Ferritin: 207 ng/mL (ref 24–336)

## 2024-11-25 LAB — GLUCOSE, CAPILLARY: Glucose-Capillary: 102 mg/dL — ABNORMAL HIGH (ref 70–99)

## 2024-11-25 MED ORDER — LACTULOSE 10 GM/15ML PO SOLN
30.0000 g | Freq: Once | ORAL | Status: AC
  Administered 2024-11-25: 30 g via ORAL
  Filled 2024-11-25: qty 60

## 2024-11-25 MED ORDER — POLYETHYLENE GLYCOL 3350 17 G PO PACK
17.0000 g | PACK | Freq: Two times a day (BID) | ORAL | Status: DC
  Administered 2024-11-26: 17 g via ORAL
  Filled 2024-11-25 (×4): qty 1

## 2024-11-25 MED ORDER — SODIUM CHLORIDE 0.9 % IV BOLUS
500.0000 mL | Freq: Once | INTRAVENOUS | Status: AC
  Administered 2024-11-25: 500 mL via INTRAVENOUS

## 2024-11-25 NOTE — Assessment & Plan Note (Addendum)
 Patient required BiPAP on presentation.  Patient wears between 2 and 5 L of oxygen at home.  Currently on 3L.  Can go up to 5 L with working with physical therapy if desaturates.

## 2024-11-25 NOTE — Progress Notes (Signed)
 Mobility Specialist - Progress Note   11/25/24 1414  Mobility  Activity Dangled on edge of bed  Level of Assistance Contact guard assist, steadying assist  Assistive Device None  Activity Response Tolerated well  Mobility visit 1 Mobility  Mobility Specialist Start Time (ACUTE ONLY) 1353  Mobility Specialist Stop Time (ACUTE ONLY) 1412  Mobility Specialist Time Calculation (min) (ACUTE ONLY) 19 min   Pt semi fowler upon entry, utilizing ~6L O2. Pt agreeable to limited mobility this date, expressed dizziness while supine. Pt required CGA to bring BLE EOB, dangled EOB for 10 mins before returning semi fowler--- O2 99% HR 76. Pt left in bed with alarm set and needs within reach.  America Silvan Mobility Specialist 11/25/2024 2:28 PM

## 2024-11-25 NOTE — Progress Notes (Signed)
 OT Cancellation Note  Patient Details Name: Geoffrey Walker MRN: [id number] DOB: 03/26/1961   Cancelled Treatment:    Reason Eval/Treat Not Completed: Patient declined, no reason specified. Chart reviewed, on arrival pt repeatedly states I'm not going to get out of bed today, and I'll do it when I can breathe better despite Spo2 reading in 90s. Pt educated on importance of mobility for functional strengthening, pt states going to the bathroom is enough of a workout. Continues to decline therapy but states will participate tomorrow. Will follow up as able.   Elston Slot, M.S. OTR/L  11/25/2024, 11:10 AM  ascom [phone]

## 2024-11-25 NOTE — TOC Progression Note (Signed)
 Transition of Care [facility]) - Progression Note    Patient Details  Name: Geoffrey Walker MRN: [id number] Date of Birth: [birth year]  Transition of Care [facility]) CM/SW Contact  [name] Hamilton, RN Phone Number: 11/25/2024, 3:03 PM  Clinical Narrative:     Late Entry:  Met with patient to discuss pending insurance authorization for rehab. Discussed with patient requirement for him to participate in PT/OT sessions at the hospital as his discharge plan is to go to rehab. Discussed that insurance must see that he is participating and this CM strongly recommended patient to participate in every session. Patient verbalized that he is short of breath and requests that his O2 be increased during therapy sessions and also requested that he receive pain medication prior to therapy sessions. This CM communicated patient's requests to care team.                     Expected Discharge Plan and Services                                               Social Drivers of Health (SDOH) Interventions SDOH Screenings   Food Insecurity: No Food Insecurity (11/09/2024)  Housing: Low Risk (11/09/2024)  Transportation Needs: No Transportation Needs (11/09/2024)  Utilities: Not At Risk (11/09/2024)  Financial Resource Strain: Low Risk (09/21/2023)   Received from [facility]  Tobacco Use: High Risk (11/07/2024)    Readmission Risk Interventions     No data to display

## 2024-11-25 NOTE — Hospital Course (Addendum)
 64 y.o. male with medical history significant of lung cancer status post L pneumonectomy, COPD, chronic hypoxemic respiratory failure in the setting of COPD on 2 L at baseline, hypertension, chronic venous stasis, tobacco dependence presented with worsening shortness of breath since 1 week.  Workup revealed influenza positive. He had some initial progress for improvement and was moving toward dc to SNF.  However, had choking event evening of 1/1 that may have resulted in aspiration.  His O2 status which was stable on 5L progressing to needing bipap at 45% and feeling unwell. CTA was negative for PE. Suspicious to me for aspiration PNA vs HAP.  Started on cefepime . Transferred to stepdown and consulted pulmonology 1/2.  SLP evaluated and he has been placed back on full diet. He is now stable on/around his home O2 level with no desats and continues to look comfortable. He is medically stable for dc to SNF when available.  Of note, patient continues to complain that he cannot breath. Refuses chest PT. Found to be smoking in room  1/14.  Taking over case today.  Patient here 17 days already.  Awaiting rehab. 1/15.  Received insurance authorization for rehab.

## 2024-11-25 NOTE — Assessment & Plan Note (Signed)
 Physical therapy recommending rehab

## 2024-11-25 NOTE — Progress Notes (Signed)
 PT Cancellation Note  Patient Details Name: Geoffrey Walker MRN: [id number] DOB: 1961/03/08   Cancelled Treatment:    Reason Eval/Treat Not Completed: Other (comment) Pt reports he did some EOB sitting/activity with mobility tech earlier this afternoon and is tickled to death about being able to do that... he refused further PT and was adamant that he didn't have any more activity in him Not today anyway.  Pt pleasant but refusing activity with PT.   Carmin JONELLE Deed 11/25/2024, 2:35 PM

## 2024-11-25 NOTE — Assessment & Plan Note (Addendum)
 Supportive care.  Discontinued isolation since he has been here 18 days.

## 2024-11-25 NOTE — Plan of Care (Signed)
" °  Problem: Respiratory: Goal: Ability to maintain adequate ventilation will improve Outcome: Progressing   Problem: Clinical Measurements: Goal: Respiratory complications will improve Outcome: Progressing   Problem: Nutrition: Goal: Adequate nutrition will be maintained Outcome: Progressing   Problem: Safety: Goal: Ability to remain free from injury will improve Outcome: Progressing   Problem: Coping: Goal: Ability to adjust to condition or change in health will improve Outcome: Progressing   "

## 2024-11-25 NOTE — Assessment & Plan Note (Addendum)
 Completed antibiotics.  Reviewed CT scan from 11/13/2024 showing no pulmonary embolism, status post right pneumonectomy with postsurgical changes including rightward shift of the heart and mediastinal structures, numerous irregular patchy reticular opacities in the periphery of the left lung pulmonary emphysema.  Last chest x-ray on 1/4 shows unchanged right pneumonectomy with right hemithorax opacification and rightward mediastinal shift consistent with postsurgical changes.

## 2024-11-25 NOTE — Progress Notes (Signed)
 " Progress Note   Patient: Geoffrey Walker FMW:[id number] DOB: 1961/09/08 DOA: 11/07/2024     18 DOS: the patient was seen and examined on 11/25/2024   Brief hospital course: 64 y.o. male with medical history significant of lung cancer status post L pneumonectomy, COPD, chronic hypoxemic respiratory failure in the setting of COPD on 2 L at baseline, hypertension, chronic venous stasis, tobacco dependence presented with worsening shortness of breath since 1 week.  Workup revealed influenza positive. He had some initial progress for improvement and was moving toward dc to SNF.  However, had choking event evening of 1/1 that may have resulted in aspiration.  His O2 status which was stable on 5L progressing to needing bipap at 45% and feeling unwell. CTA was negative for PE. Suspicious to me for aspiration PNA vs HAP.  Started on cefepime . Transferred to stepdown and consulted pulmonology 1/2.  SLP evaluated and he has been placed back on full diet. He is now stable on/around his home O2 level with no desats and continues to look comfortable. He is medically stable for dc to SNF when available.  Of note, patient continues to complain that he cannot breath. Refuses chest PT. Found to be smoking in room  1/14.  Taking over case today.  Patient here 17 days already.  Awaiting rehab.  Assessment and Plan: * Severe sepsis (HCC) Present on admission with influenza A, tachycardia tachypnea leukocytosis and lactic acidosis.  Acute on chronic hypoxic respiratory failure (HCC) Patient required BiPAP.  Patient wears between 2 and 5 L of oxygen at home.  Currently on 3L.  Influenza A with pneumonia Supportive care  Anemia, unspecified Last hemoglobin 8.8.  Add on a ferritin.  Thrombocytopenia Continue to monitor.  Patient has been refusing Lovenox  injections previously.  Will hold at this point.  Lobar pneumonia Completed antibiotics  Constipation Increase MiraLAX  to twice daily and give a dose of  lactulose .  Weakness Physical therapy recommending rehab  Essential hypertension On Coreg   Myocardial injury Secondary to sepsis  Tobacco abuse Must stop smoking        Subjective: Patient complains of shortness of breath.  Patient is breathing comfortably and on baseline oxygen.  Initially admitted with influenza infection.  Admitted 17 days ago with sepsis.  Physical Exam: Vitals:   11/24/24 2056 11/25/24 0639 11/25/24 0730 11/25/24 0809  BP:  (!) 114/57  94/69  Pulse:  76  70  Resp:  17  16  Temp:  97.8 F (36.6 C)    TempSrc:  Oral    SpO2: 99% 97% 100% 100%  Weight:      Height:       Physical Exam HENT:     Head: Normocephalic.     Mouth/Throat:     Pharynx: No oropharyngeal exudate.  Eyes:     General: Lids are normal.  Cardiovascular:     Rate and Rhythm: Normal rate and regular rhythm.     Heart sounds: Normal heart sounds, S1 normal and S2 normal.  Pulmonary:     Breath sounds: Examination of the right-lower field reveals decreased breath sounds. Examination of the left-lower field reveals decreased breath sounds. Decreased breath sounds present. No wheezing, rhonchi or rales.  Abdominal:     Palpations: Abdomen is soft.     Tenderness: There is no abdominal tenderness.  Musculoskeletal:     Right lower leg: No swelling.     Left lower leg: No swelling.  Skin:    General: Skin is warm.  Findings: No rash.  Neurological:     Mental Status: He is alert and oriented to person, place, and time.     Data Reviewed: Creatinine 0.93, CO2 36, white blood cell count 7.0, hemoglobin 8.8, platelet count 118  Disposition: Status is: Inpatient Remains inpatient appropriate because: Waiting to go on to rehab  Planned Discharge Destination: Rehab    Time spent: 28 minutes  Author: Charlie Patterson, MD 11/25/2024 1:22 PM  For on call review [email or web address].  "

## 2024-11-25 NOTE — Assessment & Plan Note (Addendum)
 Continue MiraLAX  to twice daily and as needed lactulose .

## 2024-11-25 NOTE — Assessment & Plan Note (Deleted)
 Last hemoglobin 8.8.  Add on a ferritin.

## 2024-11-25 NOTE — Assessment & Plan Note (Signed)
 Must stop smoking

## 2024-11-25 NOTE — Assessment & Plan Note (Signed)
 Secondary to sepsis.

## 2024-11-25 NOTE — Assessment & Plan Note (Addendum)
 Holding medications since blood pressure on the lower side.

## 2024-11-25 NOTE — Plan of Care (Signed)
" °  Problem: Fluid Volume: Goal: Hemodynamic stability will improve Outcome: Progressing   Problem: Clinical Measurements: Goal: Diagnostic test results will improve Outcome: Progressing   Problem: Respiratory: Goal: Ability to maintain adequate ventilation will improve Outcome: Progressing   Problem: Nutrition: Goal: Adequate nutrition will be maintained Outcome: Progressing   "

## 2024-11-25 NOTE — Assessment & Plan Note (Addendum)
 Continue to monitor.  Patient has been refusing Lovenox  injections previously.  Will hold at this point.

## 2024-11-25 NOTE — Assessment & Plan Note (Signed)
 Present on admission with influenza A, tachycardia tachypnea leukocytosis and lactic acidosis.

## 2024-11-26 DIAGNOSIS — J09X1 Influenza due to identified novel influenza A virus with pneumonia: Secondary | ICD-10-CM | POA: Diagnosis not present

## 2024-11-26 DIAGNOSIS — I10 Essential (primary) hypertension: Secondary | ICD-10-CM | POA: Diagnosis not present

## 2024-11-26 DIAGNOSIS — D638 Anemia in other chronic diseases classified elsewhere: Secondary | ICD-10-CM | POA: Insufficient documentation

## 2024-11-26 DIAGNOSIS — A419 Sepsis, unspecified organism: Secondary | ICD-10-CM | POA: Diagnosis not present

## 2024-11-26 DIAGNOSIS — I5A Non-ischemic myocardial injury (non-traumatic): Secondary | ICD-10-CM | POA: Diagnosis not present

## 2024-11-26 DIAGNOSIS — K5909 Other constipation: Secondary | ICD-10-CM | POA: Diagnosis not present

## 2024-11-26 DIAGNOSIS — R531 Weakness: Secondary | ICD-10-CM | POA: Diagnosis not present

## 2024-11-26 DIAGNOSIS — E878 Other disorders of electrolyte and fluid balance, not elsewhere classified: Secondary | ICD-10-CM | POA: Diagnosis not present

## 2024-11-26 DIAGNOSIS — D696 Thrombocytopenia, unspecified: Secondary | ICD-10-CM | POA: Diagnosis not present

## 2024-11-26 DIAGNOSIS — R6520 Severe sepsis without septic shock: Secondary | ICD-10-CM | POA: Diagnosis not present

## 2024-11-26 DIAGNOSIS — J181 Lobar pneumonia, unspecified organism: Secondary | ICD-10-CM | POA: Diagnosis not present

## 2024-11-26 DIAGNOSIS — Z720 Tobacco use: Secondary | ICD-10-CM | POA: Diagnosis not present

## 2024-11-26 LAB — CBC
HCT: 28.1 % — ABNORMAL LOW (ref 39.0–52.0)
Hemoglobin: 8.6 g/dL — ABNORMAL LOW (ref 13.0–17.0)
MCH: 29.8 pg (ref 26.0–34.0)
MCHC: 30.6 g/dL (ref 30.0–36.0)
MCV: 97.2 fL (ref 80.0–100.0)
Platelets: 119 K/uL — ABNORMAL LOW (ref 150–400)
RBC: 2.89 MIL/uL — ABNORMAL LOW (ref 4.22–5.81)
RDW: 14.7 % (ref 11.5–15.5)
WBC: 5.8 K/uL (ref 4.0–10.5)
nRBC: 0 % (ref 0.0–0.2)

## 2024-11-26 LAB — BASIC METABOLIC PANEL WITH GFR
Anion gap: 4 — ABNORMAL LOW (ref 5–15)
BUN: 11 mg/dL (ref 8–23)
CO2: 37 mmol/L — ABNORMAL HIGH (ref 22–32)
Calcium: 8.9 mg/dL (ref 8.9–10.3)
Chloride: 99 mmol/L (ref 98–111)
Creatinine, Ser: 0.84 mg/dL (ref 0.61–1.24)
GFR, Estimated: >60 mL/min
Glucose, Bld: 97 mg/dL (ref 70–99)
Potassium: 4.6 mmol/L (ref 3.5–5.1)
Sodium: 139 mmol/L (ref 135–145)

## 2024-11-26 LAB — GLUCOSE, CAPILLARY: Glucose-Capillary: 104 mg/dL — ABNORMAL HIGH (ref 70–99)

## 2024-11-26 MED ORDER — UMECLIDINIUM-VILANTEROL 62.5-25 MCG/ACT IN AEPB: 1.0000 | INHALATION_SPRAY | Freq: Every day | RESPIRATORY_TRACT | 0 refills | Status: AC

## 2024-11-26 MED ORDER — CYCLOBENZAPRINE HCL 5 MG PO TABS: 5.0000 mg | ORAL_TABLET | Freq: Three times a day (TID) | ORAL | 0 refills | Status: AC | PRN

## 2024-11-26 MED ORDER — TRAZODONE HCL 50 MG PO TABS: 25.0000 mg | ORAL_TABLET | Freq: Every evening | ORAL | 0 refills | Status: AC | PRN

## 2024-11-26 MED ORDER — ACETAMINOPHEN 325 MG PO TABS: 650.0000 mg | ORAL_TABLET | Freq: Four times a day (QID) | ORAL | Status: AC | PRN

## 2024-11-26 MED ORDER — LACTULOSE 10 GM/15ML PO SOLN: 30.0000 g | Freq: Every day | ORAL | 0 refills | Status: AC | PRN

## 2024-11-26 MED ORDER — FAMOTIDINE 20 MG PO TABS: 20.0000 mg | ORAL_TABLET | Freq: Every day | ORAL | 0 refills | Status: AC

## 2024-11-26 MED ORDER — HYDROXYZINE HCL 50 MG PO TABS: 50.0000 mg | ORAL_TABLET | Freq: Three times a day (TID) | ORAL | 0 refills | Status: AC | PRN

## 2024-11-26 MED ORDER — LIDOCAINE 5 % EX PTCH: 1.0000 | MEDICATED_PATCH | CUTANEOUS | 0 refills | Status: AC

## 2024-11-26 MED ORDER — ALBUTEROL SULFATE (2.5 MG/3ML) 0.083% IN NEBU: 2.5000 mg | INHALATION_SOLUTION | Freq: Two times a day (BID) | RESPIRATORY_TRACT | 0 refills | Status: AC

## 2024-11-26 MED ORDER — GUAIFENESIN 100 MG/5ML PO LIQD: 5.0000 mL | ORAL | 0 refills | Status: AC | PRN

## 2024-11-26 MED ORDER — POLYETHYLENE GLYCOL 3350 17 G PO PACK: 17.0000 g | PACK | Freq: Two times a day (BID) | ORAL | 0 refills | Status: AC

## 2024-11-26 MED ORDER — ALPRAZOLAM 0.25 MG PO TABS: 0.2500 mg | ORAL_TABLET | Freq: Two times a day (BID) | ORAL | 0 refills | Status: AC | PRN

## 2024-11-26 MED ORDER — OXYCODONE HCL 5 MG PO TABS: 5.0000 mg | ORAL_TABLET | Freq: Four times a day (QID) | ORAL | 0 refills | Status: AC | PRN

## 2024-11-26 NOTE — Plan of Care (Signed)
  Problem: Pain Managment: Goal: General experience of comfort will improve and/or be controlled Outcome: Progressing   Problem: Safety: Goal: Ability to remain free from injury will improve Outcome: Progressing

## 2024-11-26 NOTE — TOC Transition Note (Deleted)
 Transition of Care [facility]) - Discharge Note   Patient Details  Name: Geoffrey Walker MRN: [id number] Date of Birth: 1961-08-28  [facility]) CM/SW Contact:  [name] Hamilton, RN Phone Number: 11/26/2024, 2:17 PM   Clinical Narrative:     Patient will DC to: [name]  Anticipated DC date: 11/26/2024 Family notified: Patient verbalized he will notify his family once he arrives at the facility. Transport by: [facility], arranged via [facility]. Estimated pickup time 3p-5p, Trip ID [id number]  Per MD patient ready for DC to [facility]. RN, patient, and facility notified of DC. Discharge Summary sent to facility. RN given number for report. DC packet on chart. Ambulance transport requested for patient.   TOC signing off.   Final next level of care: Skilled Nursing Facility Barriers to Discharge: Barriers Resolved   Patient Goals and CMS Choice            Discharge Placement              Patient chooses bed at: [facility] Patient to be transferred to facility by: [facility] arranged via [facility]- Trip ID [id number] Name of family member notified: patient elected to call family himself Patient and family notified of of transfer: 11/26/24  Discharge Plan and Services Additional resources added to the After Visit Summary for                                       Social Drivers of Health (SDOH) Interventions SDOH Screenings   Food Insecurity: No Food Insecurity (11/09/2024)  Housing: Low Risk (11/09/2024)  Transportation Needs: No Transportation Needs (11/09/2024)  Utilities: Not At Risk (11/09/2024)  Financial Resource Strain: Low Risk (09/21/2023)   Received from [facility]  Tobacco Use: High Risk (11/07/2024)     Readmission Risk Interventions     No data to display

## 2024-11-26 NOTE — Assessment & Plan Note (Signed)
 Last hemoglobin 8.6, ferritin 207

## 2024-11-26 NOTE — TOC Progression Note (Addendum)
 Transition of Care [facility]) - Progression Note    Patient Details  Name: Geoffrey Walker MRN: [id number] Date of Birth: August 20, 1961  [facility]) CM/SW Contact  [name] Hamilton, RN Phone Number: 11/26/2024, 3:02 PM  Clinical Narrative:     This CM notified by [facility] that [facility] will not pick up patient earlier than 7-8pm. [facility] notified and advised that they can not admit patient at that time and he will have to come tomorrow.   Notified Care Team, discharge order will remain as patient is medically ready for discharge.  Notified [facility] and requested pick up for tomorrow 11/27/24 at 9am, Trip [place] [id number]. This CM notified by [facility] that [facility] will be here for transport tomorrow between 11:30am-12pm, Care Team notified.       Barriers to Discharge: Barriers Resolved               Expected Discharge Plan and Services         Expected Discharge Date: 11/26/24                                     Social Drivers of Health (SDOH) Interventions SDOH Screenings   Food Insecurity: No Food Insecurity (11/09/2024)  Housing: Low Risk (11/09/2024)  Transportation Needs: No Transportation Needs (11/09/2024)  Utilities: Not At Risk (11/09/2024)  Financial Resource Strain: Low Risk (09/21/2023)   [facility] from [facility]  Tobacco Use: High Risk (11/07/2024)    Readmission Risk Interventions     No data to display

## 2024-11-26 NOTE — Discharge Summary (Signed)
 " Physician Discharge Summary   Patient: Geoffrey Walker MRN: [id number] DOB: 1961/11/10  Admit date:     11/07/2024  Discharge date: 11/26/24  Discharge Physician: Charlie Patterson   PCP: [facility]   Recommendations at discharge:   Follow-up team at rehab 1 day Referral to pulmonology 2 weeks Check CBC in 1 to 2 weeks  Discharge Diagnoses: Principal Problem:   Severe sepsis (HCC) Active Problems:   Acute on chronic hypoxic respiratory failure (HCC)   Influenza A with pneumonia   Hypokalemia   Tobacco abuse   Myocardial injury   Essential hypertension   Electrolyte abnormality   Weakness   Constipation   Lobar pneumonia   Thrombocytopenia   Anemia of chronic disease   Hospital Course: 64 y.o. male with medical history significant of lung cancer status post L pneumonectomy, COPD, chronic hypoxemic respiratory failure in the setting of COPD on 2 L at baseline, hypertension, chronic venous stasis, tobacco dependence presented with worsening shortness of breath since 1 week.  Workup revealed influenza positive. He had some initial progress for improvement and was moving toward dc to SNF.  However, had choking event evening of 1/1 that may have resulted in aspiration.  His O2 status which was stable on 5L progressing to needing bipap at 45% and feeling unwell. CTA was negative for PE. Suspicious to me for aspiration PNA vs HAP.  Started on cefepime . Transferred to stepdown and consulted pulmonology 1/2.  SLP evaluated and he has been placed back on full diet. He is now stable on/around his home O2 level with no desats and continues to look comfortable. He is medically stable for dc to SNF when available.  Of note, patient continues to complain that he cannot breath. Refuses chest PT. Found to be smoking in room  1/14.  Taking over case today.  Patient here 17 days already.  Awaiting rehab. 1/15.  Received insurance authorization for rehab.  Assessment and Plan: *  Severe sepsis (HCC) Present on admission with influenza A, tachycardia tachypnea leukocytosis and lactic acidosis.  Acute on chronic hypoxic respiratory failure (HCC) Patient required BiPAP.  Patient wears between 2 and 5 L of oxygen at home.  Currently on 3L.  Can go up to 5 L with working with physical therapy if desaturates.  Influenza A with pneumonia Supportive care.  Discontinued isolation since he has been here 18 days.  Anemia of chronic disease Last hemoglobin 8.6, ferritin 207  Thrombocytopenia Continue to monitor.  Patient has been refusing Lovenox  injections previously.  Will hold at this point.  Lobar pneumonia Completed antibiotics.  Reviewed CT scan from 11/13/2024 showing no pulmonary embolism, status post right pneumonectomy with postsurgical changes including rightward shift of the heart and mediastinal structures, numerous irregular patchy reticular opacities in the periphery of the left lung pulmonary emphysema.  Last chest x-ray on 1/4 shows unchanged right pneumonectomy with right hemithorax opacification and rightward mediastinal shift consistent with postsurgical changes.  Constipation Continue MiraLAX  to twice daily and as needed lactulose .  Weakness Physical therapy recommending rehab  Electrolyte abnormality Hypokalemia and hypophosphatemia treated during hospital course  Essential hypertension Holding medications since blood pressure on the lower side.  Myocardial injury Secondary to sepsis  Tobacco abuse Must stop smoking         Consultants: Critical care team Procedures performed: None Disposition: Rehabilitation facility Diet recommendation:  Regular DISCHARGE MEDICATION: Allergies as of 11/26/2024       Reactions   Penicillins Dermatitis, Other (See Comments)  unsure Unsure of what happens, just knows he was told he had an allergic reaction when he was a child. Unsure, has been since he was a child        Medication List      STOP taking these medications    carvedilol  12.5 MG tablet Commonly known as: COREG    cholecalciferol 1000 units tablet Commonly known as: VITAMIN D   hydrochlorothiazide 25 MG tablet Commonly known as: HYDRODIURIL   HYDROmorphone  2 MG tablet Commonly known as: Dilaudid    loratadine  10 MG tablet Commonly known as: CLARITIN    multivitamin capsule   rosuvastatin  5 MG tablet Commonly known as: CRESTOR    vitamin B-12 50 MCG tablet Commonly known as: CYANOCOBALAMIN   vitamin E 180 MG (400 UNITS) capsule       TAKE these medications    acetaminophen  325 MG tablet Commonly known as: TYLENOL  Take 2 tablets (650 mg total) by mouth every 6 (six) hours as needed for mild pain (pain score 1-3) or fever (or Fever >/= 101).   albuterol  108 (90 Base) MCG/ACT inhaler Commonly known as: VENTOLIN  HFA Inhale 2 puffs into the lungs every 4 (four) hours as needed for wheezing or shortness of breath. What changed: Another medication with the same name was added. Make sure you understand how and when to take each.   albuterol  (2.5 MG/3ML) 0.083% nebulizer solution Commonly known as: PROVENTIL  Take 3 mLs (2.5 mg total) by nebulization 2 (two) times daily. What changed: You were already taking a medication with the same name, and this prescription was added. Make sure you understand how and when to take each.   ALPRAZolam  0.25 MG tablet Commonly known as: XANAX  Take 1 tablet (0.25 mg total) by mouth 2 (two) times daily as needed for anxiety (not relieved by hydroxizine).   atorvastatin  40 MG tablet Commonly known as: LIPITOR Take 40 mg by mouth daily.   cyclobenzaprine  5 MG tablet Commonly known as: FLEXERIL  Take 1 tablet (5 mg total) by mouth 3 (three) times daily as needed for muscle spasms. What changed:  how much to take how to take this when to take this reasons to take this additional instructions   famotidine  20 MG tablet Commonly known as: PEPCID  Take 1 tablet (20  mg total) by mouth daily. Start taking on: November 27, 2024 What changed: when to take this   guaiFENesin  100 MG/5ML liquid Commonly known as: ROBITUSSIN Take 5 mLs by mouth every 4 (four) hours as needed for cough or to loosen phlegm.   hydrOXYzine  50 MG tablet Commonly known as: ATARAX  Take 1 tablet (50 mg total) by mouth 3 (three) times daily as needed for anxiety.   lactulose  10 GM/15ML solution Commonly known as: CHRONULAC  Take 45 mLs (30 g total) by mouth daily as needed for mild constipation or severe constipation.   lidocaine  5 % Commonly known as: LIDODERM  Place 1 patch onto the skin daily. Remove & Discard patch within 12 hours (place to area of pain)   melatonin 3 MG Tabs tablet Take 6 mg by mouth at bedtime.   oxyCODONE  5 MG immediate release tablet Commonly known as: Oxy IR/ROXICODONE  Take 1 tablet (5 mg total) by mouth every 6 (six) hours as needed for severe pain (pain score 7-10).   polyethylene glycol 17 g packet Commonly known as: MIRALAX  / GLYCOLAX  Take 17 g by mouth 2 (two) times daily.   traZODone  50 MG tablet Commonly known as: DESYREL  Take 0.5 tablets (25 mg total) by  mouth at bedtime as needed for sleep.   umeclidinium-vilanterol 62.5-25 MCG/ACT Aepb Commonly known as: ANORO ELLIPTA  Inhale 1 puff into the lungs daily.        Contact information for follow-up providers     Assaker, Darrin, MD Follow up in 2 week(s).   Specialty: Pulmonary Disease Contact information: [address] [place] [address] [phone]              Contact information for after-discharge care     Destination     [facility] .   Service: Skilled Nursing Contact information: [address] [place]  [address] [phone]                    Discharge Exam: Filed Weights   11/13/24 1015 11/18/24 0521 11/19/24 0500  Weight: 91 kg 96.8 kg 96.6 kg   Physical Exam HENT:     Head: Normocephalic.      Mouth/Throat:     Pharynx: No oropharyngeal exudate.  Eyes:     General: Lids are normal.  Cardiovascular:     Rate and Rhythm: Normal rate and regular rhythm.     Heart sounds: Normal heart sounds, S1 normal and S2 normal.  Pulmonary:     Breath sounds: Examination of the right-middle field reveals decreased breath sounds. Examination of the right-lower field reveals decreased breath sounds. Examination of the left-lower field reveals decreased breath sounds. Decreased breath sounds present. No wheezing, rhonchi or rales.  Abdominal:     Palpations: Abdomen is soft.     Tenderness: There is no abdominal tenderness.  Musculoskeletal:     Right lower leg: No swelling.     Left lower leg: No swelling.  Skin:    General: Skin is warm.     Findings: No rash.  Neurological:     Mental Status: He is alert and oriented to person, place, and time.      Condition at discharge: fair  The results of significant diagnostics from this hospitalization (including imaging, microbiology, ancillary and laboratory) are listed below for reference.   Imaging Studies: MR BRAIN W WO CONTRAST Result Date: 11/15/2024 EXAM: MRI BRAIN WITH AND WITHOUT CONTRAST 11/15/2024 11:46:43 AM TECHNIQUE: Multiplanar multisequence MRI of the head/brain was performed with and without the administration of 9 mL gadobutrol  (GADAVIST ) 1 MMOL/ML IV SOLN. COMPARISON: None available. CLINICAL HISTORY: Palate weakness (CN 9) and possible aspiration. History of lung cancer. FINDINGS: BRAIN AND VENTRICLES: No acute infarct. No acute intracranial hemorrhage. No mass effect or midline shift. No hydrocephalus. Ventricular and scattered subcortical T2 hyperintensities are moderately advanced for age. Mild generalized atrophy is present. The sella is unremarkable. Normal flow voids. No mass or abnormal enhancement. ORBITS: No acute abnormality. SINUSES: Mild mucosal thickening is present in the inferior maxillary sinuses bilaterally. BONES  AND SOFT TISSUES: Normal bone marrow signal and enhancement. Bilateral mastoid effusions are present. IMPRESSION: 1. No acute intracranial abnormality. 2. Moderately advanced ventricular and scattered subcortical T2 hyperintensities for age. Electronically signed by: Lonni Necessary MD 11/15/2024 12:01 PM EST RP Workstation: [id number]   DG Chest Port 1 View Result Date: 11/15/2024 EXAM: 1 VIEW(S) XRAY OF THE CHEST 11/15/2024 05:09:00 AM COMPARISON: CXR 11/13/2024 and CT angio chest 11/13/2024. CLINICAL HISTORY: Shortness of breath. FINDINGS: LUNGS AND PLEURA: Right pneumonectomy. Right hemithorax opacification with rightward mediastinal shift, unchanged and consistent with postsurgical changes. Surgical clips are present over the right hilum. No focal pulmonary opacity is seen in the left  lung. No pleural effusion or pneumothorax is identified in the left hemithorax. HEART AND MEDIASTINUM: The mediastinal silhouette is shifted rightward due to postsurgical changes. BONES AND SOFT TISSUES: Redemonstration of widening of the seventh and eighth ribs in the setting of known intrathoracic right posterolateral chest wall herniation, best seen on CT angio chest 11/13/2024. IMPRESSION: 1. Unchanged right pneumonectomy with right hemithorax opacification and rightward mediastinal shift, consistent with postsurgical changes. 2. Widening of the seventh and eighth ribs in the setting of known intrathoracic right posterolateral chest wall herniation , best seen on CT angio chest 11/13/2024. Electronically signed by: Morgane Naveau MD 11/15/2024 08:19 AM EST RP Workstation: [id number]   ECHOCARDIOGRAM COMPLETE Result Date: 11/13/2024    ECHOCARDIOGRAM REPORT   Patient Name:   TAL KEMPKER Date of Exam: 11/13/2024 Medical Rec #:  [id number]     Height:       70.0 in Accession #:    [id number]    Weight:       200.6 lb Date of Birth:  [birth year]     BSA:          2.090 m Patient Age:    63 years      BP:           144/100  mmHg Patient Gender: M             HR:           87 bpm. Exam Location:  [facility] Procedure: 2D Echo, Cardiac Doppler and Color Doppler (Both Spectral and Color            Flow Doppler were utilized during procedure). Indications:     Dyspnea R06.00  History:         Patient has no prior history of Echocardiogram examinations.                  Signs/Symptoms:Dyspnea.  Sonographer:     Rosina Dunk Referring Phys:  [id number] DELAYNE LULLA SOLIAN Diagnosing Phys: Keller Paterson  Sonographer Comments: Technically difficult study due to poor echo windows and no apical window. IMPRESSIONS  1. Poor study quality. No apical views and limited images in other views.  2. Left ventricle incompletely visualized. Normal systolic function, LVEF 55-60% based on limited parasternal views. Wall motion analysis cannot be assessed.  3. Right ventrile not well visualized. Appears dilated with reduced function based on very limited views.  4. The mitral valve was not well visualized. No evidence of mitral valve regurgitation.  5. The aortic valve was not well visualized. Aortic valve regurgitation is not visualized. FINDINGS  Left Ventricle: Left ventricle incompletely visualized. Normal systolic function, LVEF 55-60% based on limited parasternal views. Wall motion analysis cannot be assessed. Right Ventricle: Right ventrile not well visualized. Appears dilated with reduced function based on very limited views. Left Atrium: Left atrial size was not well visualized. Right Atrium: Right atrial size was not well visualized. Pericardium: There is no evidence of pericardial effusion. Mitral Valve: The mitral valve was not well visualized. No evidence of mitral valve regurgitation. Tricuspid Valve: The tricuspid valve is not well visualized. Tricuspid valve regurgitation is trivial. Aortic Valve: The aortic valve was not well visualized. Aortic valve regurgitation is not visualized. Pulmonic Valve: The pulmonic valve was not well visualized.  Pulmonic valve regurgitation is not visualized. Aorta: The aortic root is normal in size and structure. IAS/Shunts: The interatrial septum was not well visualized.  LEFT VENTRICLE PLAX 2D LVIDd:  4.00 cm LVIDs:         3.10 cm LV PW:         1.10 cm LV IVS:        1.50 cm LVOT diam:     2.20 cm LVOT Area:     3.80 cm  IVC IVC diam: 1.80 cm LEFT ATRIUM         Index LA diam:    3.80 cm 1.82 cm/m                        PULMONIC VALVE AORTA                 PV Vmax:        0.63 m/s Ao Root diam: 3.20 cm PV Vmean:       43.600 cm/s                       PV VTI:         0.096 m                       PV Peak grad:   1.6 mmHg                       PV Mean grad:   1.0 mmHg                       RVOT Peak grad: 1 mmHg   SHUNTS Systemic Diam: 2.20 cm Pulmonic VTI:  0.095 m Keller Paterson Electronically signed by Keller Paterson Signature Date/Time: 11/13/2024/5:27:57 PM    Final    CT Angio Chest Pulmonary Embolism (PE) W or WO Contrast Result Date: 11/13/2024 EXAM: CTA CHEST 11/13/2024 08:15:00 AM TECHNIQUE: CTA of the chest was performed without and with the administration of 75 mL of iohexol  (OMNIPAQUE ) 350 MG/ML injection. Multiplanar reformatted images are provided for review. MIP images are provided for review. Automated exposure control, iterative reconstruction, and/or weight based adjustment of the mA/kV was utilized to reduce the radiation dose to as low as reasonably achievable. COMPARISON: None available. CLINICAL HISTORY: Pulmonary embolism (PE) suspected, high prob. FINDINGS: PULMONARY ARTERIES: Pulmonary arteries are adequately opacified for evaluation. No acute pulmonary embolus. Main pulmonary artery is normal in caliber. MEDIASTINUM: The heart and mediastinal structures are shifted to the right. The heart and pericardium demonstrate no acute abnormality. There is no acute abnormality of the thoracic aorta. LYMPH NODES: No mediastinal, hilar or axillary lymphadenopathy. LUNGS AND PLEURA: The patient is  status post right pneumonectomy. There are numerous irregular patchy reticular opacities present within the periphery of the left lung. There is mild central lobular emphysema. No evidence of pleural effusion or pneumothorax. UPPER ABDOMEN: Limited images of the upper abdomen demonstrate stones independently within the gallbladder. 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 No acute soft tissue abnormality. IMPRESSION: 1. No evidence of pulmonary embolus. 2. 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 3. Numerous irregular patchy reticular opacities in the periphery of the left lung. 4. Mild central lobular emphysema. Pulmonary emphysema is an independent risk factor for  lung cancer. Recommend consideration for evaluation for a low-dose CT lung cancer screening program. 5. Cholelithiasis. Electronically signed by: Evalene Coho MD 11/13/2024 08:39 AM EST RP Workstation: [id number]   DG Chest Port 1 View Result Date: 11/13/2024 EXAM: 1 VIEW(S) XRAY OF THE CHEST 11/13/2024 02:37:50 AM COMPARISON: 11/12/2024 CLINICAL HISTORY: Shortness of breath FINDINGS: LUNGS AND PLEURA: Right pneumonectomy. Complete opacification of right hemithorax. HEART AND MEDIASTINUM: Rightward mediastinal shift, unchanged. BONES AND SOFT TISSUES: No acute osseous abnormality. IMPRESSION: 1. Right pneumonectomy with complete opacification of the right hemithorax. 2. No acute findings. Electronically signed by: Dorethia Molt MD 11/13/2024 03:36 AM EST RP Workstation: [id number]   DG Chest Port 1 View Result Date: 11/12/2024 CLINICAL  DATA:  Dyspnea. EXAM: PORTABLE CHEST 1 VIEW COMPARISON:  11/07/2024 FINDINGS: Complete opacification of the right hemithorax with rightward mediastinal shift, unchanged from prior exam. Peribronchial thickening in the left lung with subsegmental scarring in the periphery of the midlung zone. No confluent opacity. No pneumothorax or significant left pleural effusion. IMPRESSION: 1. Complete opacification of the right hemithorax with rightward mediastinal shift, consistent with prior pneumonectomy. 2. Peribronchial thickening in the left lung with subsegmental scarring in the periphery of the midlung zone. Electronically Signed   By: Andrea Gasman M.D.   On: 11/12/2024 18:50   DG Chest Port 1 View Result Date: 11/07/2024 CLINICAL DATA:  Shortness of breath. Status post RIGHT pneumonectomy 5 years ago. EXAM: PORTABLE CHEST - 1 VIEW COMPARISON:  02/04/2016 FINDINGS: Complete opacification of the RIGHT hemithorax with RIGHT mediastinal shift consistent with provided history of a total pneumonectomy. Mild scattered atelectasis of the LEFT lung without focal airspace opacity indicative pneumonia. IMPRESSION: 1. Complete opacification of the RIGHT hemithorax consistent with provided history of total pneumonectomy. 2. No acute cardiopulmonary process. Electronically Signed   By: Aliene Lloyd M.D.   On: 11/07/2024 16:16    Microbiology: Results for orders placed or performed during the hospital encounter of 11/07/24  Culture, blood (Routine x 2)     Status: None   Collection Time: 11/07/24  2:50 PM   Specimen: BLOOD  Result Value Ref Range Status   Specimen Description BLOOD BLOOD RIGHT WRIST  Final   Special Requests   Final    BOTTLES DRAWN AEROBIC AND ANAEROBIC Blood Culture results may not be optimal due to an inadequate volume of blood received in culture bottles   Culture   Final    NO GROWTH 5 DAYS Performed at [facility], [address], [place], [place] [address]    Report Status  11/12/2024 FINAL  Final  Culture, blood (Routine x 2)     Status: None   Collection Time: 11/07/24  2:50 PM   Specimen: BLOOD  Result Value Ref Range Status   Specimen Description BLOOD BLOOD RIGHT FOREARM  Final   Special Requests   Final    BOTTLES DRAWN AEROBIC AND ANAEROBIC Blood Culture adequate volume   Culture   Final    NO GROWTH 5 DAYS Performed at [facility], [address] [place], [place] [address]    Report Status 11/12/2024 FINAL  Final  Resp panel by RT-PCR (RSV, Flu A&B, Covid) Anterior Nasal Swab     Status: Abnormal   Collection Time: 11/07/24  2:53 PM   Specimen: Anterior Nasal Swab  Result Value Ref Range Status   SARS Coronavirus 2 by RT PCR NEGATIVE NEGATIVE Final    Comment: (NOTE) SARS-CoV-2 target nucleic acids are NOT DETECTED.  The SARS-CoV-2  RNA is generally detectable in upper respiratory specimens during the acute phase of infection. The lowest concentration of SARS-CoV-2 viral copies this assay can detect is 138 copies/mL. A negative result does not preclude SARS-Cov-2 infection and should not be used as the sole basis for treatment or other patient management decisions. A negative result may occur with  improper specimen collection/handling, submission of specimen other than nasopharyngeal swab, presence of viral mutation(s) within the areas targeted by this assay, and inadequate number of viral copies(<138 copies/mL). A negative result must be combined with clinical observations, patient history, and epidemiological information. The expected result is Negative.  Fact Sheet for Patients:  bloggercourse.com  Fact Sheet for Healthcare Providers:  seriousbroker.it  This test is no t yet approved or cleared by the [place]  FDA and  has been authorized for detection and/or diagnosis of SARS-CoV-2 by FDA under an Emergency Use Authorization (EUA). This EUA will remain  in effect  (meaning this test can be used) for the duration of the COVID-19 declaration under Section 564(b)(1) of the Act, 21 U.S.C.section 360bbb-3(b)(1), unless the authorization is terminated  or revoked sooner.       Influenza A by PCR POSITIVE (A) NEGATIVE Final   Influenza B by PCR NEGATIVE NEGATIVE Final    Comment: (NOTE) The Xpert Xpress SARS-CoV-2/FLU/RSV plus assay is intended as an aid in the diagnosis of influenza from Nasopharyngeal swab specimens and should not be used as a sole basis for treatment. Nasal washings and aspirates are unacceptable for Xpert Xpress SARS-CoV-2/FLU/RSV testing.  Fact Sheet for Patients: bloggercourse.com  Fact Sheet for Healthcare Providers: seriousbroker.it  This test is not yet approved or cleared by the [place]  FDA and has been authorized for detection and/or diagnosis of SARS-CoV-2 by FDA under an Emergency Use Authorization (EUA). This EUA will remain in effect (meaning this test can be used) for the duration of the COVID-19 declaration under Section 564(b)(1) of the Act, 21 U.S.C. section 360bbb-3(b)(1), unless the authorization is terminated or revoked.     Resp Syncytial Virus by PCR NEGATIVE NEGATIVE Final    Comment: (NOTE) Fact Sheet for Patients: bloggercourse.com  Fact Sheet for Healthcare Providers: seriousbroker.it  This test is not yet approved or cleared by the [place]  FDA and has been authorized for detection and/or diagnosis of SARS-CoV-2 by FDA under an Emergency Use Authorization (EUA). This EUA will remain in effect (meaning this test can be used) for the duration of the COVID-19 declaration under Section 564(b)(1) of the Act, 21 U.S.C. section 360bbb-3(b)(1), unless the authorization is terminated or revoked.  Performed at [facility], [address] [place], [place] [address]   MRSA Next Gen  by PCR, Nasal     Status: None   Collection Time: 11/13/24 10:23 AM   Specimen: Nasal Mucosa; Nasal Swab  Result Value Ref Range Status   MRSA by PCR Next Gen NOT DETECTED NOT DETECTED Final    Comment: (NOTE) The GeneXpert MRSA Assay (FDA approved for NASAL specimens only), is one component of a comprehensive MRSA colonization surveillance program. It is not intended to diagnose MRSA infection nor to guide or monitor treatment for MRSA infections. Test performance is not FDA approved in patients less than 53 years old. Performed at [facility], [address] [place], [place] [address]     Labs: CBC: Recent Labs  Lab 11/24/24 0513 11/25/24 0216 11/26/24 0556  WBC 6.8 7.0 5.8  HGB 8.8* 8.8* 8.6*  HCT 27.5* 28.4* 28.1*  MCV 94.8 94.4 97.2  PLT 125* 118* 119*   Basic Metabolic Panel: Recent Labs  Lab 11/21/24 0440 11/25/24 0216 11/26/24 0556  NA  --  138 139  K  --  4.8 4.6  CL  --  98 99  CO2  --  36* 37*  GLUCOSE  --  135* 97  BUN  --  13 11  CREATININE 0.80 0.93 0.84  CALCIUM   --  8.9 8.9   Liver Function Tests: No results for input(s): AST, ALT, ALKPHOS, BILITOT, PROT, ALBUMIN in the last 168 hours. CBG: Recent Labs  Lab 11/23/24 0819 11/24/24 0442 11/24/24 0823 11/25/24 0641 11/26/24 0443  GLUCAP 93 98 81 102* 104*    Discharge time spent: greater than 30 minutes.  Signed: Charlie Patterson, MD [facility] 11/26/2024 "

## 2024-11-26 NOTE — Assessment & Plan Note (Signed)
 Hypokalemia and hypophosphatemia treated during hospital course

## 2024-11-27 DIAGNOSIS — D696 Thrombocytopenia, unspecified: Secondary | ICD-10-CM | POA: Diagnosis not present

## 2024-11-27 DIAGNOSIS — J9621 Acute and chronic respiratory failure with hypoxia: Secondary | ICD-10-CM | POA: Diagnosis not present

## 2024-11-27 DIAGNOSIS — I10 Essential (primary) hypertension: Secondary | ICD-10-CM | POA: Diagnosis not present

## 2024-11-27 DIAGNOSIS — I5A Non-ischemic myocardial injury (non-traumatic): Secondary | ICD-10-CM | POA: Diagnosis not present

## 2024-11-27 DIAGNOSIS — D638 Anemia in other chronic diseases classified elsewhere: Secondary | ICD-10-CM | POA: Diagnosis not present

## 2024-11-27 DIAGNOSIS — J09X1 Influenza due to identified novel influenza A virus with pneumonia: Secondary | ICD-10-CM | POA: Diagnosis not present

## 2024-11-27 DIAGNOSIS — K5909 Other constipation: Secondary | ICD-10-CM | POA: Diagnosis not present

## 2024-11-27 DIAGNOSIS — R6520 Severe sepsis without septic shock: Secondary | ICD-10-CM | POA: Diagnosis not present

## 2024-11-27 DIAGNOSIS — E876 Hypokalemia: Secondary | ICD-10-CM | POA: Diagnosis not present

## 2024-11-27 DIAGNOSIS — J181 Lobar pneumonia, unspecified organism: Secondary | ICD-10-CM | POA: Diagnosis not present

## 2024-11-27 DIAGNOSIS — A419 Sepsis, unspecified organism: Secondary | ICD-10-CM | POA: Diagnosis not present

## 2024-11-27 LAB — GLUCOSE, CAPILLARY: Glucose-Capillary: 116 mg/dL — ABNORMAL HIGH (ref 70–99)

## 2024-11-27 NOTE — Discharge Summary (Signed)
 " Physician Discharge Summary   Patient: Geoffrey Walker MRN: [id number] DOB: [birth year]  Admit date:     11/07/2024  Discharge date: 11/27/24  Discharge Physician: Charlie Patterson   PCP: [facility]   Recommendations at discharge:   Follow-up team at rehab 1 day Follow-up pulmonology 2 weeks Check CBC in 1 to 2 weeks  Discharge Diagnoses: Principal Problem:   Severe sepsis (HCC) Active Problems:   Acute on chronic respiratory failure with hypoxia (HCC)   Influenza A with pneumonia   Hypokalemia   Tobacco abuse   Myocardial injury   Essential hypertension   Electrolyte abnormality   Weakness   Constipation   Lobar pneumonia   Thrombocytopenia   Anemia of chronic disease  Resolved Problems:   * No resolved hospital problems. *  Hospital Course: 64 y.o. male with medical history significant of lung cancer status post L pneumonectomy, COPD, chronic hypoxemic respiratory failure in the setting of COPD on 2 L at baseline, hypertension, chronic venous stasis, tobacco dependence presented with worsening shortness of breath since 1 week.  Workup revealed influenza positive. He had some initial progress for improvement and was moving toward dc to SNF.  However, had choking event evening of 1/1 that may have resulted in aspiration.  His O2 status which was stable on 5L progressing to needing bipap at 45% and feeling unwell. CTA was negative for PE. Suspicious to me for aspiration PNA vs HAP.  Started on cefepime . Transferred to stepdown and consulted pulmonology 1/2.  SLP evaluated and he has been placed back on full diet. He is now stable on/around his home O2 level with no desats and continues to look comfortable. He is medically stable for dc to SNF when available.  Of note, patient continues to complain that he cannot breath. Refuses chest PT. Found to be smoking in room  1/14.  Taking over case today.  Patient here 17 days already.  Awaiting rehab. 1/15.  Received  insurance authorization for rehab. Transportation unable to pick up til late and facility could not take today 1/16 stable to discharge to rehab  Assessment and Plan: * Severe sepsis (HCC) Present on admission with influenza A, tachycardia tachypnea leukocytosis and lactic acidosis.  Acute on chronic respiratory failure with hypoxia [facility]) Patient required BiPAP on presentation.  Patient wears between 2 and 5 L of oxygen at home.  Currently on 3L.  Can go up to 5 L with working with physical therapy if desaturates.  Influenza A with pneumonia Supportive care.  Discontinued isolation since he has been here 18 days.  Anemia of chronic disease Last hemoglobin 8.6, ferritin 207  Thrombocytopenia Continue to monitor.  Patient has been refusing Lovenox  injections previously.  Will hold at this point.  Lobar pneumonia Completed antibiotics.  Reviewed CT scan from 11/13/2024 showing no pulmonary embolism, status post right pneumonectomy with postsurgical changes including rightward shift of the heart and mediastinal structures, numerous irregular patchy reticular opacities in the periphery of the left lung pulmonary emphysema.  Last chest x-ray on 1/4 shows unchanged right pneumonectomy with right hemithorax opacification and rightward mediastinal shift consistent with postsurgical changes.  Constipation Continue MiraLAX  to twice daily and as needed lactulose .  Weakness Physical therapy recommending rehab  Electrolyte abnormality Hypokalemia and hypophosphatemia treated during hospital course  Essential hypertension Holding medications since blood pressure on the lower side.  Myocardial injury Secondary to sepsis  Tobacco abuse Must stop smoking         Consultants: Pulmonary Procedures  performed: None Disposition: Rehabilitation facility Diet recommendation:  Regular diet DISCHARGE MEDICATION: Allergies as of 11/27/2024       Reactions   Penicillins Dermatitis, Other (See  Comments)   unsure Unsure of what happens, just knows he was told he had an allergic reaction when he was a child. Unsure, has been since he was a child        Medication List     STOP taking these medications    carvedilol  12.5 MG tablet Commonly known as: COREG    cholecalciferol 1000 units tablet Commonly known as: VITAMIN D   hydrochlorothiazide 25 MG tablet Commonly known as: HYDRODIURIL   HYDROmorphone  2 MG tablet Commonly known as: Dilaudid    loratadine  10 MG tablet Commonly known as: CLARITIN    multivitamin capsule   rosuvastatin  5 MG tablet Commonly known as: CRESTOR    vitamin B-12 50 MCG tablet Commonly known as: CYANOCOBALAMIN   vitamin E 180 MG (400 UNITS) capsule       TAKE these medications    acetaminophen  325 MG tablet Commonly known as: TYLENOL  Take 2 tablets (650 mg total) by mouth every 6 (six) hours as needed for mild pain (pain score 1-3) or fever (or Fever >/= 101).   albuterol  108 (90 Base) MCG/ACT inhaler Commonly known as: VENTOLIN  HFA Inhale 2 puffs into the lungs every 4 (four) hours as needed for wheezing or shortness of breath. What changed: Another medication with the same name was added. Make sure you understand how and when to take each.   albuterol  (2.5 MG/3ML) 0.083% nebulizer solution Commonly known as: PROVENTIL  Take 3 mLs (2.5 mg total) by nebulization 2 (two) times daily. What changed: You were already taking a medication with the same name, and this prescription was added. Make sure you understand how and when to take each.   ALPRAZolam  0.25 MG tablet Commonly known as: XANAX  Take 1 tablet (0.25 mg total) by mouth 2 (two) times daily as needed for anxiety (not relieved by hydroxizine).   atorvastatin  40 MG tablet Commonly known as: LIPITOR Take 40 mg by mouth daily.   cyclobenzaprine  5 MG tablet Commonly known as: FLEXERIL  Take 1 tablet (5 mg total) by mouth 3 (three) times daily as needed for muscle  spasms. What changed:  how much to take how to take this when to take this reasons to take this additional instructions   famotidine  20 MG tablet Commonly known as: PEPCID  Take 1 tablet (20 mg total) by mouth daily. What changed: when to take this   guaiFENesin  100 MG/5ML liquid Commonly known as: ROBITUSSIN Take 5 mLs by mouth every 4 (four) hours as needed for cough or to loosen phlegm.   hydrOXYzine  50 MG tablet Commonly known as: ATARAX  Take 1 tablet (50 mg total) by mouth 3 (three) times daily as needed for anxiety.   lactulose  10 GM/15ML solution Commonly known as: CHRONULAC  Take 45 mLs (30 g total) by mouth daily as needed for mild constipation or severe constipation.   lidocaine  5 % Commonly known as: LIDODERM  Place 1 patch onto the skin daily. Remove & Discard patch within 12 hours (place to area of pain)   melatonin 3 MG Tabs tablet Take 6 mg by mouth at bedtime.   oxyCODONE  5 MG immediate release tablet Commonly known as: Oxy IR/ROXICODONE  Take 1 tablet (5 mg total) by mouth every 6 (six) hours as needed for severe pain (pain score 7-10).   polyethylene glycol 17 g packet Commonly known as: MIRALAX  / GLYCOLAX   Take 17 g by mouth 2 (two) times daily.   traZODone  50 MG tablet Commonly known as: DESYREL  Take 0.5 tablets (25 mg total) by mouth at bedtime as needed for sleep.   umeclidinium-vilanterol 62.5-25 MCG/ACT Aepb Commonly known as: ANORO ELLIPTA  Inhale 1 puff into the lungs daily.        Contact information for follow-up providers     Assaker, Darrin, MD Follow up in 2 week(s).   Specialty: Pulmonary Disease Contact information: [address] [place] [address] [phone]              Contact information for after-discharge care     Destination     [facility] .   Service: Skilled Nursing Contact information: [address] [place]  [address] [phone]                     Discharge Exam: Filed Weights   11/13/24 1015 11/18/24 0521 11/19/24 0500  Weight: 91 kg 96.8 kg 96.6 kg   Physical Exam HENT:     Head: Normocephalic.     Mouth/Throat:     Pharynx: No oropharyngeal exudate.  Eyes:     General: Lids are normal.  Cardiovascular:     Rate and Rhythm: Normal rate and regular rhythm.     Heart sounds: Normal heart sounds, S1 normal and S2 normal.  Pulmonary:     Breath sounds: Examination of the right-middle field reveals decreased breath sounds. Examination of the right-lower field reveals decreased breath sounds. Examination of the left-lower field reveals decreased breath sounds. Decreased breath sounds present. No wheezing, rhonchi or rales.     Comments: Upper airway transmitted sounds Abdominal:     Palpations: Abdomen is soft.     Tenderness: There is no abdominal tenderness.  Musculoskeletal:     Right lower leg: No swelling.     Left lower leg: No swelling.  Skin:    General: Skin is warm.     Findings: No rash.  Neurological:     Mental Status: He is alert and oriented to person, place, and time.      Condition at discharge: stable  The results of significant diagnostics from this hospitalization (including imaging, microbiology, ancillary and laboratory) are listed below for reference.   Imaging Studies: MR BRAIN W WO CONTRAST Result Date: 11/15/2024 EXAM: MRI BRAIN WITH AND WITHOUT CONTRAST 11/15/2024 11:46:43 AM TECHNIQUE: Multiplanar multisequence MRI of the head/brain was performed with and without the administration of 9 mL gadobutrol  (GADAVIST ) 1 MMOL/ML IV SOLN. COMPARISON: None available. CLINICAL HISTORY: Palate weakness (CN 9) and possible aspiration. History of lung cancer. FINDINGS: BRAIN AND VENTRICLES: No acute infarct. No acute intracranial hemorrhage. No mass effect or midline shift. No hydrocephalus. Ventricular and scattered subcortical T2 hyperintensities are moderately advanced for age. Mild generalized atrophy is  present. The sella is unremarkable. Normal flow voids. No mass or abnormal enhancement. ORBITS: No acute abnormality. SINUSES: Mild mucosal thickening is present in the inferior maxillary sinuses bilaterally. BONES AND SOFT TISSUES: Normal bone marrow signal and enhancement. Bilateral mastoid effusions are present. IMPRESSION: 1. No acute intracranial abnormality. 2. Moderately advanced ventricular and scattered subcortical T2 hyperintensities for age. Electronically signed by: Lonni Necessary MD 11/15/2024 12:01 PM EST RP Workstation: [id number]   DG Chest Port 1 View Result Date: 11/15/2024 EXAM: 1 VIEW(S) XRAY OF THE CHEST 11/15/2024 05:09:00 AM COMPARISON: CXR 11/13/2024 and CT angio chest 11/13/2024. CLINICAL HISTORY: Shortness of breath. FINDINGS: LUNGS AND PLEURA:  Right pneumonectomy. Right hemithorax opacification with rightward mediastinal shift, unchanged and consistent with postsurgical changes. Surgical clips are present over the right hilum. No focal pulmonary opacity is seen in the left lung. No pleural effusion or pneumothorax is identified in the left hemithorax. HEART AND MEDIASTINUM: The mediastinal silhouette is shifted rightward due to postsurgical changes. BONES AND SOFT TISSUES: Redemonstration of widening of the seventh and eighth ribs in the setting of known intrathoracic right posterolateral chest wall herniation, best seen on CT angio chest 11/13/2024. IMPRESSION: 1. Unchanged right pneumonectomy with right hemithorax opacification and rightward mediastinal shift, consistent with postsurgical changes. 2. Widening of the seventh and eighth ribs in the setting of known intrathoracic right posterolateral chest wall herniation , best seen on CT angio chest 11/13/2024. Electronically signed by: Morgane Naveau MD 11/15/2024 08:19 AM EST RP Workstation: [id number]   ECHOCARDIOGRAM COMPLETE Result Date: 11/13/2024    ECHOCARDIOGRAM REPORT   Patient Name:   Geoffrey Walker Date of Exam:  11/13/2024 Medical Rec #:  [id number]     Height:       70.0 in Accession #:    [id number]    Weight:       200.6 lb Date of Birth:  [birth year]     BSA:          2.090 m Patient Age:    63 years      BP:           144/100 mmHg Patient Gender: M             HR:           87 bpm. Exam Location:  [facility] Procedure: 2D Echo, Cardiac Doppler and Color Doppler (Both Spectral and Color            Flow Doppler were utilized during procedure). Indications:     Dyspnea R06.00  History:         Patient has no prior history of Echocardiogram examinations.                  Signs/Symptoms:Dyspnea.  Sonographer:     Rosina Dunk Referring Phys:  [id number] DELAYNE LULLA SOLIAN Diagnosing Phys: Keller Paterson  Sonographer Comments: Technically difficult study due to poor echo windows and no apical window. IMPRESSIONS  1. Poor study quality. No apical views and limited images in other views.  2. Left ventricle incompletely visualized. Normal systolic function, LVEF 55-60% based on limited parasternal views. Wall motion analysis cannot be assessed.  3. Right ventrile not well visualized. Appears dilated with reduced function based on very limited views.  4. The mitral valve was not well visualized. No evidence of mitral valve regurgitation.  5. The aortic valve was not well visualized. Aortic valve regurgitation is not visualized. FINDINGS  Left Ventricle: Left ventricle incompletely visualized. Normal systolic function, LVEF 55-60% based on limited parasternal views. Wall motion analysis cannot be assessed. Right Ventricle: Right ventrile not well visualized. Appears dilated with reduced function based on very limited views. Left Atrium: Left atrial size was not well visualized. Right Atrium: Right atrial size was not well visualized. Pericardium: There is no evidence of pericardial effusion. Mitral Valve: The mitral valve was not well visualized. No evidence of mitral valve regurgitation. Tricuspid Valve: The tricuspid valve is not well  visualized. Tricuspid valve regurgitation is trivial. Aortic Valve: The aortic valve was not well visualized. Aortic valve regurgitation is not visualized. Pulmonic Valve: The pulmonic valve was not well visualized. Pulmonic valve regurgitation is not  visualized. Aorta: The aortic root is normal in size and structure. IAS/Shunts: The interatrial septum was not well visualized.  LEFT VENTRICLE PLAX 2D LVIDd:         4.00 cm LVIDs:         3.10 cm LV PW:         1.10 cm LV IVS:        1.50 cm LVOT diam:     2.20 cm LVOT Area:     3.80 cm  IVC IVC diam: 1.80 cm LEFT ATRIUM         Index LA diam:    3.80 cm 1.82 cm/m                        PULMONIC VALVE AORTA                 PV Vmax:        0.63 m/s Ao Root diam: 3.20 cm PV Vmean:       43.600 cm/s                       PV VTI:         0.096 m                       PV Peak grad:   1.6 mmHg                       PV Mean grad:   1.0 mmHg                       RVOT Peak grad: 1 mmHg   SHUNTS Systemic Diam: 2.20 cm Pulmonic VTI:  0.095 m Keller Paterson Electronically signed by Keller Paterson Signature Date/Time: 11/13/2024/5:27:57 PM    Final    CT Angio Chest Pulmonary Embolism (PE) W or WO Contrast Result Date: 11/13/2024 EXAM: CTA CHEST 11/13/2024 08:15:00 AM TECHNIQUE: CTA of the chest was performed without and with the administration of 75 mL of iohexol  (OMNIPAQUE ) 350 MG/ML injection. Multiplanar reformatted images are provided for review. MIP images are provided for review. Automated exposure control, iterative reconstruction, and/or weight based adjustment of the mA/kV was utilized to reduce the radiation dose to as low as reasonably achievable. COMPARISON: None available. CLINICAL HISTORY: Pulmonary embolism (PE) suspected, high prob. FINDINGS: PULMONARY ARTERIES: Pulmonary arteries are adequately opacified for evaluation. No acute pulmonary embolus. Main pulmonary artery is normal in caliber. MEDIASTINUM: The heart and mediastinal structures are shifted to the  right. The heart and pericardium demonstrate no acute abnormality. There is no acute abnormality of the thoracic aorta. LYMPH NODES: No mediastinal, hilar or axillary lymphadenopathy. LUNGS AND PLEURA: The patient is status post right pneumonectomy. There are numerous irregular patchy reticular opacities present within the periphery of the left lung. There is mild central lobular emphysema. No evidence of pleural effusion or pneumothorax. UPPER ABDOMEN: Limited images of the upper abdomen demonstrate stones independently within the gallbladder. 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 No acute soft tissue abnormality. IMPRESSION: 1. No evidence of pulmonary embolus. 2. 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 3. Numerous irregular patchy reticular opacities in the periphery of the left lung. 4. Mild central lobular emphysema. Pulmonary emphysema is an independent risk factor for lung cancer. Recommend consideration for evaluation for a low-dose CT lung cancer screening program. 5. Cholelithiasis. Electronically signed by: Evalene Coho MD 11/13/2024 08:39 AM EST RP Workstation: [id number]   DG Chest Port 1 View Result Date: 11/13/2024 EXAM: 1 VIEW(S) XRAY OF THE CHEST 11/13/2024 02:37:50 AM COMPARISON: 11/12/2024 CLINICAL HISTORY: Shortness of breath FINDINGS: LUNGS AND PLEURA: Right pneumonectomy. Complete opacification of right hemithorax. HEART AND MEDIASTINUM: Rightward mediastinal shift, unchanged. BONES AND SOFT TISSUES: No acute osseous abnormality. IMPRESSION: 1. Right pneumonectomy with  complete opacification of the right hemithorax. 2. No acute findings. Electronically signed by: Dorethia Molt MD 11/13/2024 03:36 AM EST RP Workstation: [id number]   DG Chest Port 1 View Result Date: 11/12/2024 CLINICAL DATA:  Dyspnea. EXAM: PORTABLE CHEST 1 VIEW COMPARISON:  11/07/2024 FINDINGS: Complete opacification of the right hemithorax with rightward mediastinal shift, unchanged from prior exam. Peribronchial thickening in the left lung with subsegmental scarring in the periphery of the midlung zone. No confluent opacity. No pneumothorax or significant left pleural effusion. IMPRESSION: 1. Complete opacification of the right hemithorax with rightward mediastinal shift, consistent with prior pneumonectomy. 2. Peribronchial thickening in the left lung with subsegmental scarring in the periphery of the midlung zone. Electronically Signed   By: Andrea Gasman M.D.   On: 11/12/2024 18:50   DG Chest Port 1 View Result Date: 11/07/2024 CLINICAL DATA:  Shortness of breath. Status post RIGHT pneumonectomy 5 years ago. EXAM: PORTABLE CHEST - 1 VIEW COMPARISON:  02/04/2016 FINDINGS: Complete opacification of the RIGHT hemithorax with RIGHT mediastinal shift consistent with provided history of a total pneumonectomy. Mild scattered atelectasis of the LEFT lung without focal airspace opacity indicative pneumonia. IMPRESSION: 1. Complete opacification of the RIGHT hemithorax consistent with provided history of total pneumonectomy. 2. No acute cardiopulmonary process. Electronically Signed   By: Aliene Lloyd M.D.   On: 11/07/2024 16:16    Microbiology: Results for orders placed or performed during the hospital encounter of 11/07/24  Culture, blood (Routine x 2)     Status: None   Collection Time: 11/07/24  2:50 PM   Specimen: BLOOD  Result Value Ref Range Status   Specimen Description BLOOD BLOOD RIGHT WRIST  Final   Special Requests   Final    BOTTLES DRAWN AEROBIC AND ANAEROBIC Blood Culture results may  not be optimal due to an inadequate volume of blood received in culture bottles   Culture   Final    NO GROWTH 5 DAYS Performed at [facility], [address], [place], [place] [address]    Report Status 11/12/2024 FINAL  Final  Culture, blood (Routine x 2)     Status: None   Collection Time: 11/07/24  2:50 PM   Specimen: BLOOD  Result Value Ref Range Status   Specimen Description BLOOD BLOOD RIGHT FOREARM  Final   Special Requests   Final    BOTTLES DRAWN AEROBIC AND ANAEROBIC Blood Culture adequate volume   Culture   Final    NO GROWTH 5 DAYS Performed at [facility], [address] [place], [place] [address]    Report Status 11/12/2024 FINAL  Final  Resp panel by RT-PCR (RSV, Flu A&B, Covid) Anterior Nasal Swab     Status: Abnormal   Collection Time: 11/07/24  2:53 PM   Specimen: Anterior Nasal  Swab  Result Value Ref Range Status   SARS Coronavirus 2 by RT PCR NEGATIVE NEGATIVE Final    Comment: (NOTE) SARS-CoV-2 target nucleic acids are NOT DETECTED.  The SARS-CoV-2 RNA is generally detectable in upper respiratory specimens during the acute phase of infection. The lowest concentration of SARS-CoV-2 viral copies this assay can detect is 138 copies/mL. A negative result does not preclude SARS-Cov-2 infection and should not be used as the sole basis for treatment or other patient management decisions. A negative result may occur with  improper specimen collection/handling, submission of specimen other than nasopharyngeal swab, presence of viral mutation(s) within the areas targeted by this assay, and inadequate number of viral copies(<138 copies/mL). A negative result must be combined with clinical observations, patient history, and epidemiological information. The expected result is Negative.  Fact Sheet for Patients:  bloggercourse.com  Fact Sheet for Healthcare Providers:  seriousbroker.it  This  test is no t yet approved or cleared by the [place]  FDA and  has been authorized for detection and/or diagnosis of SARS-CoV-2 by FDA under an Emergency Use Authorization (EUA). This EUA will remain  in effect (meaning this test can be used) for the duration of the COVID-19 declaration under Section 564(b)(1) of the Act, 21 U.S.C.section 360bbb-3(b)(1), unless the authorization is terminated  or revoked sooner.       Influenza A by PCR POSITIVE (A) NEGATIVE Final   Influenza B by PCR NEGATIVE NEGATIVE Final    Comment: (NOTE) The Xpert Xpress SARS-CoV-2/FLU/RSV plus assay is intended as an aid in the diagnosis of influenza from Nasopharyngeal swab specimens and should not be used as a sole basis for treatment. Nasal washings and aspirates are unacceptable for Xpert Xpress SARS-CoV-2/FLU/RSV testing.  Fact Sheet for Patients: bloggercourse.com  Fact Sheet for Healthcare Providers: seriousbroker.it  This test is not yet approved or cleared by the [place]  FDA and has been authorized for detection and/or diagnosis of SARS-CoV-2 by FDA under an Emergency Use Authorization (EUA). This EUA will remain in effect (meaning this test can be used) for the duration of the COVID-19 declaration under Section 564(b)(1) of the Act, 21 U.S.C. section 360bbb-3(b)(1), unless the authorization is terminated or revoked.     Resp Syncytial Virus by PCR NEGATIVE NEGATIVE Final    Comment: (NOTE) Fact Sheet for Patients: bloggercourse.com  Fact Sheet for Healthcare Providers: seriousbroker.it  This test is not yet approved or cleared by the [place]  FDA and has been authorized for detection and/or diagnosis of SARS-CoV-2 by FDA under an Emergency Use Authorization (EUA). This EUA will remain in effect (meaning this test can be used) for the duration of the COVID-19 declaration under  Section 564(b)(1) of the Act, 21 U.S.C. section 360bbb-3(b)(1), unless the authorization is terminated or revoked.  Performed at [facility], [address] [place], [place] [address]   MRSA Next Gen by PCR, Nasal     Status: None   Collection Time: 11/13/24 10:23 AM   Specimen: Nasal Mucosa; Nasal Swab  Result Value Ref Range Status   MRSA by PCR Next Gen NOT DETECTED NOT DETECTED Final    Comment: (NOTE) The GeneXpert MRSA Assay (FDA approved for NASAL specimens only), is one component of a comprehensive MRSA colonization surveillance program. It is not intended to diagnose MRSA infection nor to guide or monitor treatment for MRSA infections. Test performance is not FDA approved in patients less than 2 years old. Performed at [facility], [address] [place],  [place] [address]     Labs: CBC: Recent Labs  Lab 11/24/24 0513 11/25/24 0216 11/26/24 0556  WBC 6.8 7.0 5.8  HGB 8.8* 8.8* 8.6*  HCT 27.5* 28.4* 28.1*  MCV 94.8 94.4 97.2  PLT 125* 118* 119*   Basic Metabolic Panel: Recent Labs  Lab 11/21/24 0440 11/25/24 0216 11/26/24 0556  NA  --  138 139  K  --  4.8 4.6  CL  --  98 99  CO2  --  36* 37*  GLUCOSE  --  135* 97  BUN  --  13 11  CREATININE 0.80 0.93 0.84  CALCIUM   --  8.9 8.9   Liver Function Tests: No results for input(s): AST, ALT, ALKPHOS, BILITOT, PROT, ALBUMIN in the last 168 hours. CBG: Recent Labs  Lab 11/24/24 0442 11/24/24 0823 11/25/24 0641 11/26/24 0443 11/27/24 0600  GLUCAP 98 81 102* 104* 116*    Discharge time spent: less than 30 minutes.  Signed: Charlie Patterson, MD [facility] 11/27/2024 "

## 2024-11-27 NOTE — TOC Transition Note (Signed)
 Transition of Care [facility]) - Discharge Note   Patient Details  Name: Geoffrey Walker MRN: [id number] Date of Birth: [birth year]  [facility] CM/SW Contact:  [name] Hamilton, RN Phone Number: 11/27/2024, 9:23 AM   Clinical Narrative:     Patient will DC to: [name] Anticipated DC date: 11/27/2024 Family notified: by patient Transport by: [facility] via [facility]- Trip ID [id number]  Per MD patient ready for DC to [facility]. RN, patient, and facility notified of DC. Discharge Summary sent to facility. RN given number for report. DC packet on chart. Ambulance transport requested for patient.   TOC signing off.   Final next level of care: Skilled Nursing Facility Barriers to Discharge: Barriers Resolved   Patient Goals and CMS Choice            Discharge Placement              Patient chooses bed at: [facility] Patient to be transferred to facility by: [facility] arranged via [facility]- Trip ID [id number] Name of family member notified: patient elected to call family himself after he arrives at facility Patient and family notified of of transfer: 11/27/24  Discharge Plan and Services Additional resources added to the After Visit Summary for                                       Social Drivers of Health (SDOH) Interventions SDOH Screenings   Food Insecurity: No Food Insecurity (11/09/2024)  Housing: Low Risk (11/09/2024)  Transportation Needs: No Transportation Needs (11/09/2024)  Utilities: Not At Risk (11/09/2024)  Financial Resource Strain: Low Risk (09/21/2023)   Received from [facility]  Tobacco Use: High Risk (11/07/2024)     Readmission Risk Interventions     No data to display

## 2024-11-27 NOTE — Plan of Care (Signed)
  Problem: Coping: Goal: Level of anxiety will decrease Outcome: Progressing   Problem: Nutrition: Goal: Adequate nutrition will be maintained Outcome: Progressing   Problem: Activity: Goal: Risk for activity intolerance will decrease Outcome: Progressing   Problem: Elimination: Goal: Will not experience complications related to bowel motility Outcome: Progressing
